# Patient Record
Sex: Female | Born: 1975 | Race: Black or African American | Hispanic: No | Marital: Married | Smoking: Never smoker
Health system: Southern US, Community
[De-identification: ages and names within clinical notes are randomized; demographics above are authoritative.]

## PROBLEM LIST (undated history)

## (undated) DIAGNOSIS — Z975 Presence of (intrauterine) contraceptive device: Secondary | ICD-10-CM

## (undated) DIAGNOSIS — J45909 Unspecified asthma, uncomplicated: Secondary | ICD-10-CM

## (undated) HISTORY — DX: Unspecified asthma, uncomplicated: J45.909

## (undated) HISTORY — DX: Presence of (intrauterine) contraceptive device: Z97.5

---

## 1999-09-28 ENCOUNTER — Encounter: Payer: Self-pay | Admitting: *Deleted

## 1999-09-28 ENCOUNTER — Ambulatory Visit (HOSPITAL_COMMUNITY): Admission: RE | Admit: 1999-09-28 | Discharge: 1999-09-28 | Payer: Self-pay | Admitting: *Deleted

## 2000-01-19 ENCOUNTER — Inpatient Hospital Stay (HOSPITAL_COMMUNITY): Admission: AD | Admit: 2000-01-19 | Discharge: 2000-01-22 | Payer: Self-pay | Admitting: *Deleted

## 2001-02-19 ENCOUNTER — Other Ambulatory Visit: Admission: RE | Admit: 2001-02-19 | Discharge: 2001-02-19 | Payer: Self-pay | Admitting: Obstetrics & Gynecology

## 2001-09-02 ENCOUNTER — Inpatient Hospital Stay (HOSPITAL_COMMUNITY): Admission: AD | Admit: 2001-09-02 | Discharge: 2001-09-03 | Payer: Self-pay | Admitting: Obstetrics and Gynecology

## 2003-05-12 ENCOUNTER — Other Ambulatory Visit: Admission: RE | Admit: 2003-05-12 | Discharge: 2003-05-12 | Payer: Self-pay | Admitting: *Deleted

## 2003-11-28 ENCOUNTER — Inpatient Hospital Stay (HOSPITAL_COMMUNITY): Admission: AD | Admit: 2003-11-28 | Discharge: 2003-12-01 | Payer: Self-pay | Admitting: Obstetrics and Gynecology

## 2005-09-02 ENCOUNTER — Inpatient Hospital Stay (HOSPITAL_COMMUNITY): Admission: AD | Admit: 2005-09-02 | Discharge: 2005-09-02 | Payer: Self-pay | Admitting: Obstetrics and Gynecology

## 2005-09-06 ENCOUNTER — Inpatient Hospital Stay (HOSPITAL_COMMUNITY): Admission: AD | Admit: 2005-09-06 | Discharge: 2005-09-09 | Payer: Self-pay | Admitting: Obstetrics and Gynecology

## 2007-02-21 ENCOUNTER — Inpatient Hospital Stay (HOSPITAL_COMMUNITY): Admission: AD | Admit: 2007-02-21 | Discharge: 2007-02-21 | Payer: Self-pay | Admitting: Obstetrics & Gynecology

## 2007-02-21 ENCOUNTER — Ambulatory Visit: Payer: Self-pay | Admitting: Physician Assistant

## 2007-02-24 ENCOUNTER — Ambulatory Visit (HOSPITAL_COMMUNITY): Admission: RE | Admit: 2007-02-24 | Discharge: 2007-02-24 | Payer: Self-pay | Admitting: Family Medicine

## 2007-02-26 ENCOUNTER — Ambulatory Visit: Payer: Self-pay | Admitting: *Deleted

## 2007-02-26 ENCOUNTER — Encounter (INDEPENDENT_AMBULATORY_CARE_PROVIDER_SITE_OTHER): Payer: Self-pay | Admitting: *Deleted

## 2007-03-12 ENCOUNTER — Ambulatory Visit: Payer: Self-pay | Admitting: Obstetrics & Gynecology

## 2007-03-26 ENCOUNTER — Ambulatory Visit: Payer: Self-pay | Admitting: *Deleted

## 2007-03-31 ENCOUNTER — Ambulatory Visit: Payer: Self-pay | Admitting: Physician Assistant

## 2007-04-07 ENCOUNTER — Ambulatory Visit: Payer: Self-pay | Admitting: Obstetrics & Gynecology

## 2007-04-14 ENCOUNTER — Ambulatory Visit: Payer: Self-pay | Admitting: Obstetrics & Gynecology

## 2007-04-16 ENCOUNTER — Ambulatory Visit (HOSPITAL_COMMUNITY): Admission: RE | Admit: 2007-04-16 | Discharge: 2007-04-16 | Payer: Self-pay | Admitting: Family Medicine

## 2007-04-20 ENCOUNTER — Ambulatory Visit: Payer: Self-pay | Admitting: Physician Assistant

## 2007-04-20 ENCOUNTER — Inpatient Hospital Stay (HOSPITAL_COMMUNITY): Admission: AD | Admit: 2007-04-20 | Discharge: 2007-04-22 | Payer: Self-pay | Admitting: Gynecology

## 2008-10-19 ENCOUNTER — Ambulatory Visit (HOSPITAL_COMMUNITY): Admission: RE | Admit: 2008-10-19 | Discharge: 2008-10-19 | Payer: Self-pay | Admitting: Family Medicine

## 2009-01-11 ENCOUNTER — Ambulatory Visit: Payer: Self-pay | Admitting: Obstetrics and Gynecology

## 2009-01-11 ENCOUNTER — Inpatient Hospital Stay (HOSPITAL_COMMUNITY): Admission: AD | Admit: 2009-01-11 | Discharge: 2009-01-11 | Payer: Self-pay | Admitting: Obstetrics & Gynecology

## 2009-01-21 ENCOUNTER — Ambulatory Visit: Payer: Self-pay | Admitting: Advanced Practice Midwife

## 2009-01-21 ENCOUNTER — Inpatient Hospital Stay (HOSPITAL_COMMUNITY): Admission: AD | Admit: 2009-01-21 | Discharge: 2009-01-23 | Payer: Self-pay | Admitting: Obstetrics & Gynecology

## 2009-08-23 IMAGING — US US OB COMP +14 WK
1 of 2 series · 14 of 28 positions shown · non-contrast
Comparison: none

OBSTETRICAL ULTRASOUND:

 This ultrasound exam was performed in the [HOSPITAL] Ultrasound Department.  The OB US report was generated in the AS system, and faxed to the ordering physician.  This report is also available in [REDACTED] PACS.

[Series 1: us ob comp +14 wk · 14 of 73 slices shown]
[im 1/73]
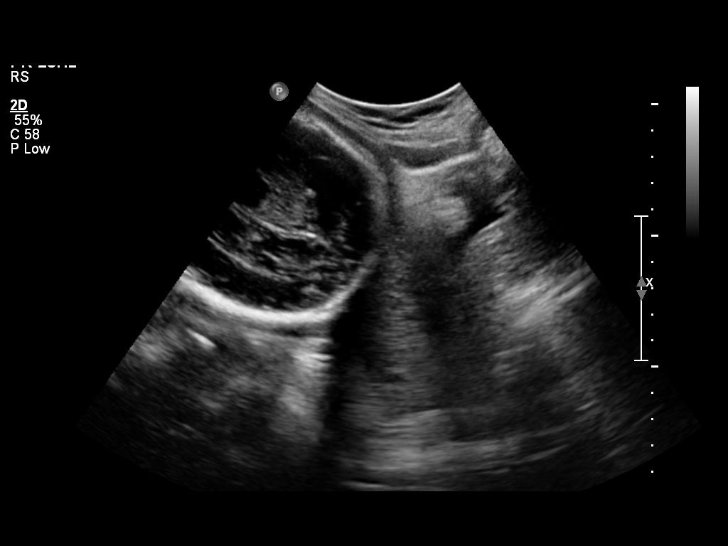
[im 6/73]
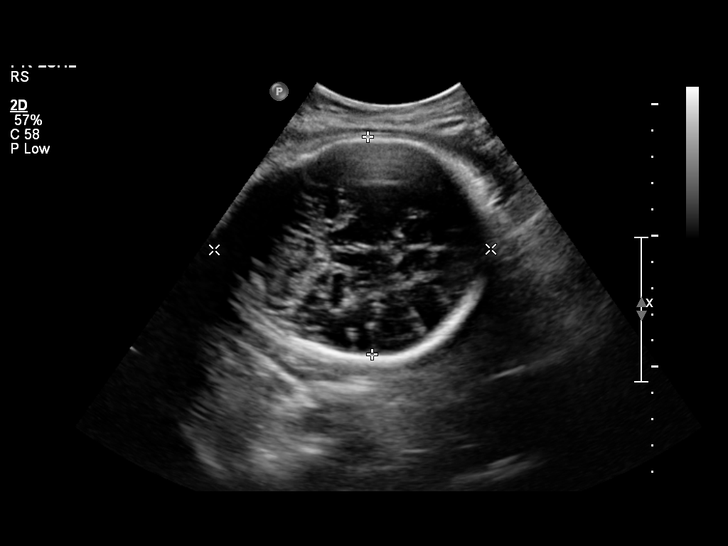
[im 12/73]
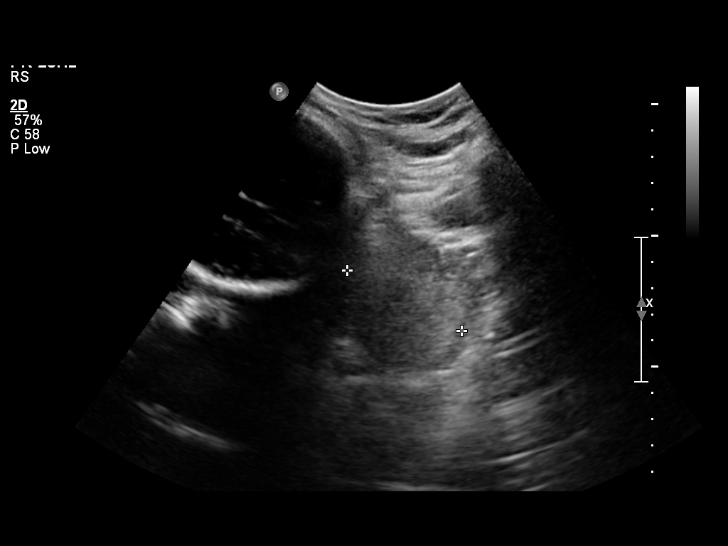
[im 17/73]
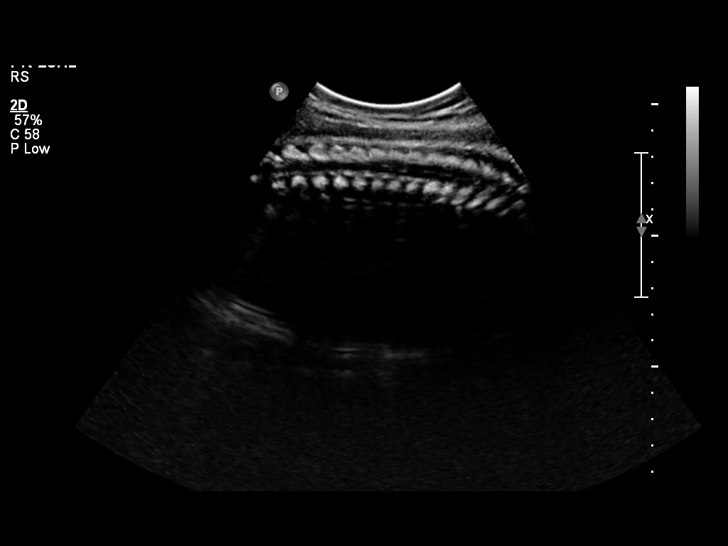
[im 23/73]
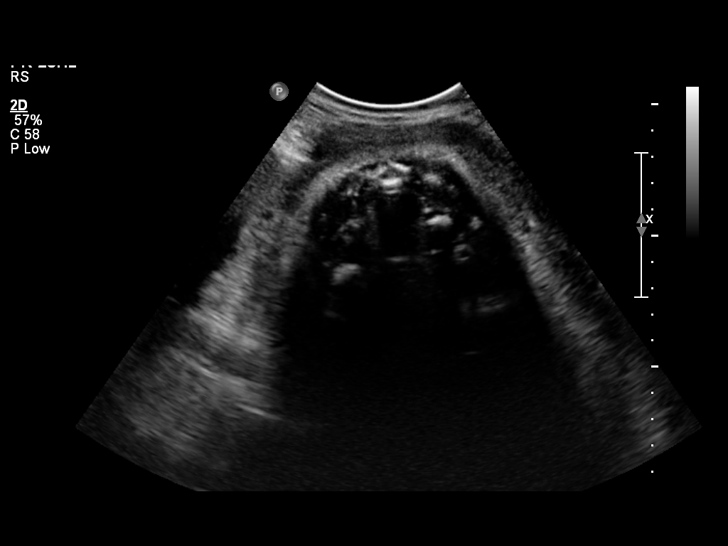
[im 28/73]
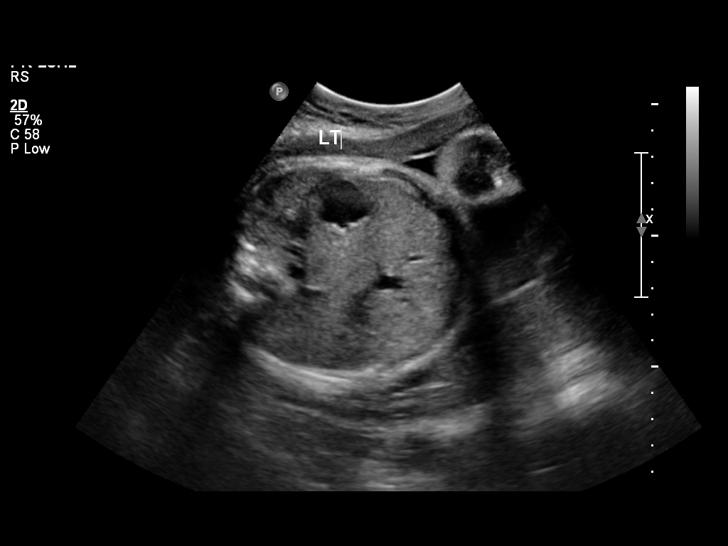
[im 34/73]
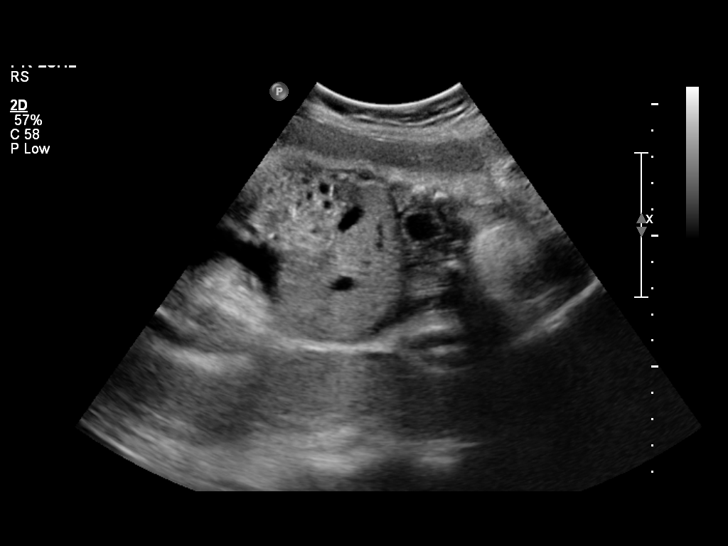
[im 39/73]
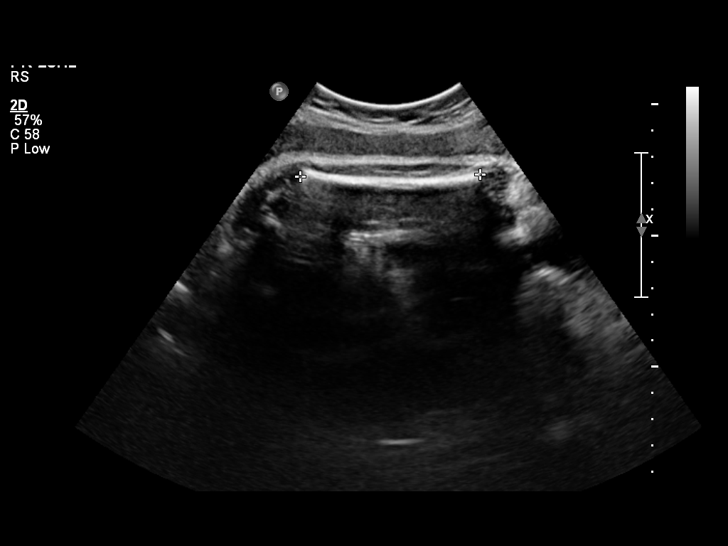
[im 45/73]
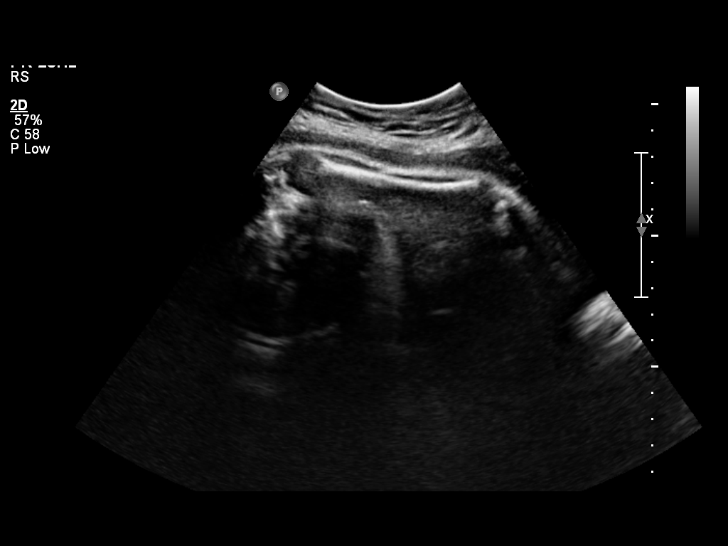
[im 50/73]
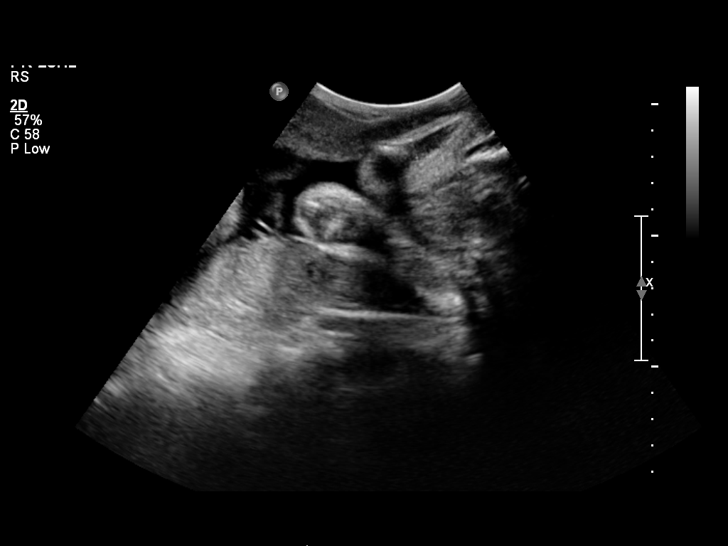
[im 56/73]
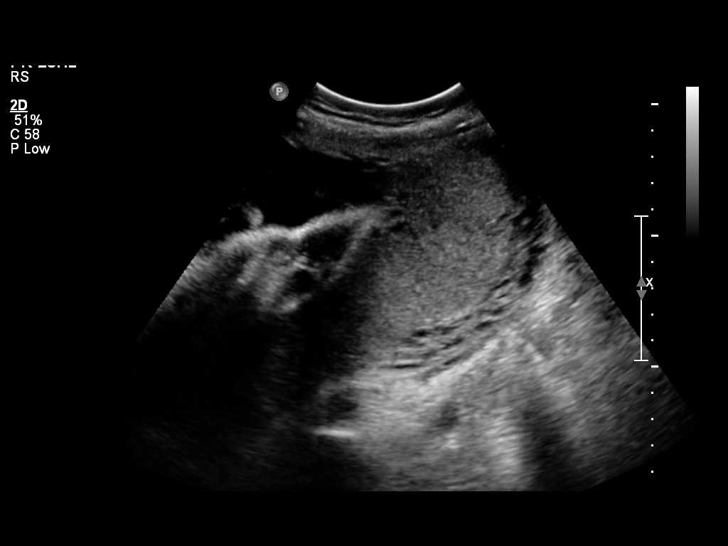
[im 61/73]
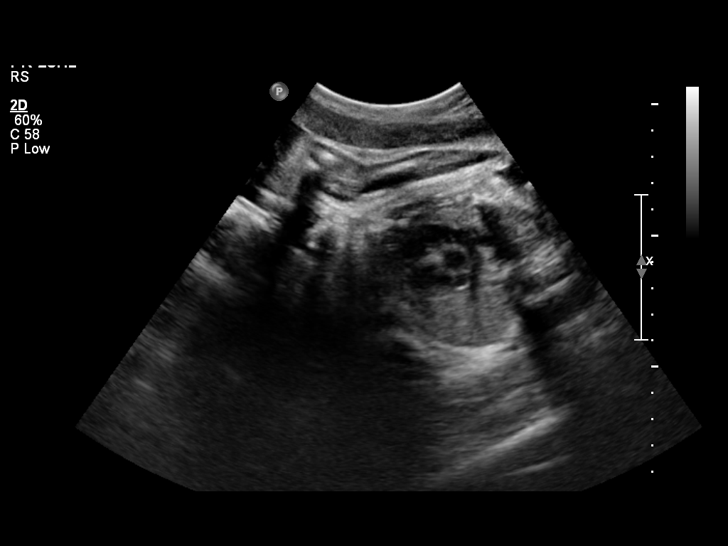
[im 67/73]
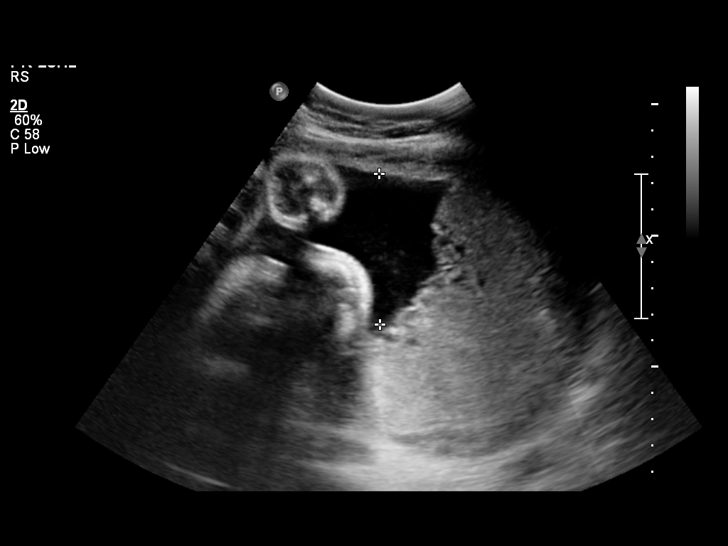
[im 73/73]
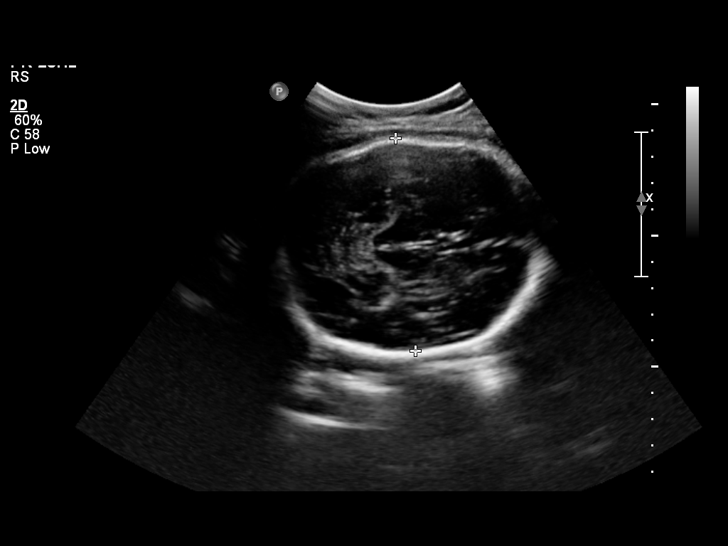

[14 of 28 positions shown; findings below may reference images not displayed]

IMPRESSION: See AS Obstetric US report.

## 2010-07-10 ENCOUNTER — Encounter: Payer: Self-pay | Admitting: *Deleted

## 2010-09-23 LAB — CBC
HCT: 34.8 % — ABNORMAL LOW (ref 36.0–46.0)
Hemoglobin: 11.9 g/dL — ABNORMAL LOW (ref 12.0–15.0)
MCV: 86.8 fL (ref 78.0–100.0)
RBC: 4.01 MIL/uL (ref 3.87–5.11)
WBC: 5.9 10*3/uL (ref 4.0–10.5)

## 2010-11-03 NOTE — H&P (Signed)
[facility]  Patient:    Shannon Knox, Shannon Knox Visit Number: [id number] MRN: [id number]          Service Type: OBS Location: [id number] Attending Physician:  Lenoard Aden Dictated by:   Marina Gravel, M.D. Admit Date:  09/02/2001                           History and Physical  ADMISSION DIAGNOSIS:          Grand multiparous patient in active labor at term.  HISTORY OF PRESENT ILLNESS:   A 35 year old African female gravida 6 para 4 A1 seen today in the office for evaluation of contractions.  Describes painful contractions every 10 to 15 minutes since last night.  Has had no leakage of fluid or vaginal bleeding.  Was found to be 6 cm dilated in the office and is admitted in labor.  PAST MEDICAL HISTORY:         None.  PAST SURGICAL HISTORY:        None.  FAMILY HISTORY:               Noncontributory.  PAST OBSTETRICAL HISTORY:     Four term spontaneous vaginal deliveries, duration of labor ranging from one to eight hours.  Birth weights ranging from 7.5 to 9.5 pounds.  No complications.  MEDICATIONS:                  Prenatal vitamins.  ALLERGIES:                    None.  PRENATAL LABORATORY DATA:     Group B strep is negative.  B positive.  Rubella immune.  Gonorrhea, chlamydia, HIV, hepatitis B, RPR - all negative.  Glucola within normal limits.  PHYSICAL EXAMINATION:  VITAL SIGNS:                  Blood pressure 90/60, weight 155.  GENERAL:                      Alert and oriented, no acute distress.  SKIN:                         Warm and dry, no lesions.  HEART:                        Regular rate and rhythm.  LUNGS:                        Clear to auscultation.  ABDOMEN:                      Gravid.  Fundal height appropriate.  Estimated fetal weight 7.5 to 8 pounds.  PELVIC:                       Cervix is 6 cm dilated, 80% effaced, -1 station, vertex presentation.  ASSESSMENT:                   Term intrauterine  pregnancy in a grand multiparous patient in spontaneous labor.  PLAN:                         Admission for expectant management of labor. Dictated by:   Gerri Spore  Earlene Plater, M.D. Attending Physician:  Lenoard Aden DD:  09/02/01 TD:  09/02/01 Job: [id number]

## 2011-03-27 LAB — CBC
HCT: 36.5
Hemoglobin: 10.7 — ABNORMAL LOW
Hemoglobin: 12.4
MCHC: 33.4
MCHC: 34
MCV: 85.2
RBC: 3.73 — ABNORMAL LOW
RBC: 4.29
RDW: 14.8 — ABNORMAL HIGH
RDW: 15.1 — ABNORMAL HIGH

## 2011-03-27 LAB — RPR: RPR Ser Ql: NONREACTIVE

## 2011-03-28 LAB — POCT URINALYSIS DIP (DEVICE)
Bilirubin Urine: NEGATIVE
Bilirubin Urine: NEGATIVE
Glucose, UA: NEGATIVE
Glucose, UA: NEGATIVE
Nitrite: NEGATIVE
Nitrite: NEGATIVE
Operator id: 194561
Operator id: 297281
Specific Gravity, Urine: 1.025
Urobilinogen, UA: 0.2
Urobilinogen, UA: 1

## 2011-03-29 LAB — POCT URINALYSIS DIP (DEVICE)
Bilirubin Urine: NEGATIVE
Glucose, UA: NEGATIVE
Glucose, UA: NEGATIVE
Glucose, UA: NEGATIVE
Hgb urine dipstick: NEGATIVE
Hgb urine dipstick: NEGATIVE
Nitrite: NEGATIVE
Nitrite: NEGATIVE
Nitrite: NEGATIVE
Operator id: 120861
Operator id: 14902
Operator id: 297281
Urobilinogen, UA: 0.2
Urobilinogen, UA: 0.2
Urobilinogen, UA: 0.2

## 2011-03-30 LAB — CBC
MCHC: 33.8
MCV: 84.4
Platelets: 199
WBC: 5.6

## 2011-03-30 LAB — HEPATITIS B SURFACE ANTIGEN: Hepatitis B Surface Ag: NEGATIVE

## 2011-03-30 LAB — URINALYSIS, ROUTINE W REFLEX MICROSCOPIC
Hgb urine dipstick: NEGATIVE
Protein, ur: NEGATIVE
Urobilinogen, UA: 0.2

## 2011-03-30 LAB — DIFFERENTIAL
Basophils Relative: 2 — ABNORMAL HIGH
Eosinophils Absolute: 0.2
Lymphs Abs: 1.6
Neutrophils Relative %: 59

## 2011-03-30 LAB — RPR: RPR Ser Ql: NONREACTIVE

## 2011-03-30 LAB — URINE CULTURE

## 2015-04-27 ENCOUNTER — Ambulatory Visit: Payer: Self-pay | Admitting: Internal Medicine

## 2015-06-24 ENCOUNTER — Other Ambulatory Visit: Payer: Self-pay | Admitting: Family Medicine

## 2015-06-24 DIAGNOSIS — R1084 Generalized abdominal pain: Secondary | ICD-10-CM

## 2015-06-24 DIAGNOSIS — R1900 Intra-abdominal and pelvic swelling, mass and lump, unspecified site: Secondary | ICD-10-CM

## 2015-07-04 ENCOUNTER — Inpatient Hospital Stay: Admission: RE | Admit: 2015-07-04 | Payer: Self-pay | Source: Ambulatory Visit

## 2015-07-05 ENCOUNTER — Other Ambulatory Visit: Payer: Self-pay

## 2015-07-12 ENCOUNTER — Ambulatory Visit
Admission: RE | Admit: 2015-07-12 | Discharge: 2015-07-12 | Disposition: A | Discharge status: Home / Self Care | Payer: 59 | Source: Ambulatory Visit | Attending: Family Medicine | Admitting: Family Medicine

## 2015-07-12 ENCOUNTER — Other Ambulatory Visit: Payer: Self-pay

## 2015-07-12 ENCOUNTER — Other Ambulatory Visit: Payer: Self-pay | Admitting: Family Medicine

## 2015-07-12 DIAGNOSIS — R1084 Generalized abdominal pain: Secondary | ICD-10-CM

## 2015-07-12 DIAGNOSIS — R1900 Intra-abdominal and pelvic swelling, mass and lump, unspecified site: Secondary | ICD-10-CM

## 2015-07-15 ENCOUNTER — Inpatient Hospital Stay: Admission: RE | Admit: 2015-07-15 | Payer: Self-pay | Source: Ambulatory Visit

## 2017-10-17 ENCOUNTER — Ambulatory Visit (INDEPENDENT_AMBULATORY_CARE_PROVIDER_SITE_OTHER): Payer: 59 | Admitting: Obstetrics & Gynecology

## 2017-10-17 ENCOUNTER — Encounter: Payer: Self-pay | Admitting: Obstetrics & Gynecology

## 2017-10-17 VITALS — BP 112/70 | Ht 63.5 in | Wt 172.0 lb

## 2017-10-17 DIAGNOSIS — Z30431 Encounter for routine checking of intrauterine contraceptive device: Secondary | ICD-10-CM | POA: Diagnosis not present

## 2017-10-17 DIAGNOSIS — N926 Irregular menstruation, unspecified: Secondary | ICD-10-CM | POA: Diagnosis not present

## 2017-10-17 DIAGNOSIS — Z01419 Encounter for gynecological examination (general) (routine) without abnormal findings: Secondary | ICD-10-CM

## 2017-10-17 NOTE — Progress Notes (Signed)
Shannon Knox [birth year] [id number]   History:    42 y.o. [id number]  Married.  Has 3 grand-children.  RP:  Established patient presenting for annual gyn exam   HPI: On Paragard IUD x 9 years.  Menses every month normal until had no menses x 3 months, then bled x 1 month every day from end of March to 4/25th.  No pelvic pain.  Normal vaginal secretions.  Breasts wnl.  Urine/BMs wnl.  BMI 29.99.  Health labs with Fam MD.  Past medical history,surgical history, family history and social history were all reviewed and documented in the EPIC chart.  Gynecologic History Patient's last menstrual period was 09/14/2017. Contraception: Paragard IUD x 9 years Last Pap: 2 yrs ago, normal per patient. Last mammogram: 2 yrs ago, normal per patient. Bone Density: Never Colonoscopy: Never  Obstetric History OB History  Gravida Para Term Preterm AB Living  SAB TAB Ectopic Multiple Live Births  1            # Outcome Date GA Lbr Len/2nd Weight Sex Delivery Anes PTL Lv  10 SAB           9 Para           8 Para           7 Para           6 Para           5 Para           4 Para           3 Para           2 Para           1 Para              ROS: A ROS was performed and pertinent positives and negatives are included in the history.  GENERAL: No fevers or chills. HEENT: No change in vision, no earache, sore throat or sinus congestion. NECK: No pain or stiffness. CARDIOVASCULAR: No chest pain or pressure. No palpitations. PULMONARY: No shortness of breath, cough or wheeze. GASTROINTESTINAL: No abdominal pain, nausea, vomiting or diarrhea, melena or bright red blood per rectum. GENITOURINARY: No urinary frequency, urgency, hesitancy or dysuria. MUSCULOSKELETAL: No joint or muscle pain, no back pain, no recent trauma. DERMATOLOGIC: No rash, no itching, no lesions. ENDOCRINE: No polyuria, polydipsia, no heat or cold intolerance. No recent change in weight. HEMATOLOGICAL: No anemia or  easy bruising or bleeding. NEUROLOGIC: No headache, seizures, numbness, tingling or weakness. PSYCHIATRIC: No depression, no loss of interest in normal activity or change in sleep pattern.     Exam:   BP 112/70   Ht 5' 3.5" (1.613 m)   Wt 172 lb (78 kg)   LMP 09/14/2017   BMI 29.99 kg/m   Body mass index is 29.99 kg/m.  General appearance : Well developed well nourished female. No acute distress HEENT: Eyes: no retinal hemorrhage or exudates,  Neck supple, trachea midline, no carotid bruits, no thyroidmegaly Lungs: Clear to auscultation, no rhonchi or wheezes, or rib retractions  Heart: Regular rate and rhythm, no murmurs or gallops Breast:Examined in sitting and supine position were symmetrical in appearance, no palpable masses or tenderness,  no skin retraction, no nipple inversion, no nipple discharge, no skin discoloration, no axillary or supraclavicular lymphadenopathy Abdomen: no palpable masses or tenderness, no rebound or guarding Extremities: no edema or skin  discoloration or tenderness  Pelvic: Vulva: Normal             Vagina: No gross lesions or discharge  Cervix: No gross lesions or discharge.  Strings well-seen.  Pap reflex done.  Uterus  AV, normal size, shape and consistency, non-tender and mobile  Adnexa  Without masses or tenderness  Anus: Nomrla   Assessment/Plan:  42 y.o. female for annual exam   1. Encounter for routine gynecological examination with Papanicolaou smear of cervix Normal gynecologic exam.  Pap reflex done.  Breast exam normal.  Schedule screening mammogram now.  Health labs with family physician.  Encouraged regular physical activity.  2. Encounter for routine checking of intrauterine contraceptive device (IUD) Paragard x 9 years.  Patient prefers to keep it up to the 10 years, will discuss further with the pelvic ultrasound results.  3. Irregular menses Menometrorrhagia for the last 1 to 2 months.  Will follow up with a pelvic ultrasound  to rule out uterine or endometrial pathology.  Will decide on management per results. - US Transvaginal Non-OB; Future  Counseling on above issues and coordination of care more than 50% for 10 minutes.  Genia Del MD, 3:53 PM 10/17/2017

## 2017-10-18 LAB — PAP IG W/ RFLX HPV ASCU

## 2017-10-20 ENCOUNTER — Encounter: Payer: Self-pay | Admitting: Obstetrics & Gynecology

## 2017-10-20 NOTE — Patient Instructions (Signed)
1. Encounter for routine gynecological examination with Papanicolaou smear of cervix Normal gynecologic exam.  Pap reflex done.  Breast exam normal.  Schedule screening mammogram now.  Health labs with family physician.  Encouraged regular physical activity.  2. Encounter for routine checking of intrauterine contraceptive device (IUD) Paragard x 9 years.  Patient prefers to keep it up to the 10 years, will discuss further with the pelvic ultrasound results.  3. Irregular menses Menometrorrhagia for the last 1 to 2 months.  Will follow up with a pelvic ultrasound to rule out uterine or endometrial pathology.  Will decide on management per results. - US Transvaginal Non-OB; Future  Shannon Knox, it was a pleasure seeing you today!  I will inform you of your results as soon as they are available.

## 2017-11-19 ENCOUNTER — Ambulatory Visit: Payer: 59 | Admitting: Obstetrics & Gynecology

## 2017-11-19 ENCOUNTER — Other Ambulatory Visit: Payer: 59

## 2018-01-08 IMAGING — US US ABDOMEN LIMITED
1 series · 10 of 10 positions shown · non-contrast
Comparison: No recent prior.

CLINICAL DATA: Palpable lump.

EXAM:
LIMITED ABDOMINAL ULTRASOUND

[Series 1: us abdomen limited · 0.06mm/px · 10 of 10 slices shown]
[im 1/10]
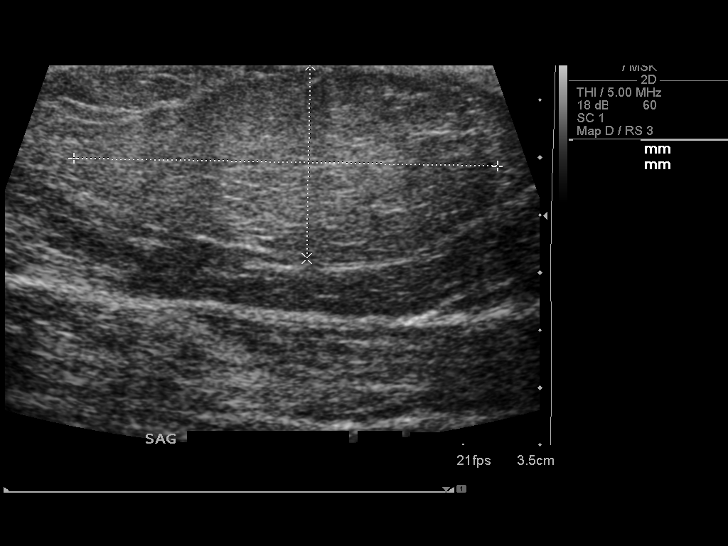
[im 2/10]
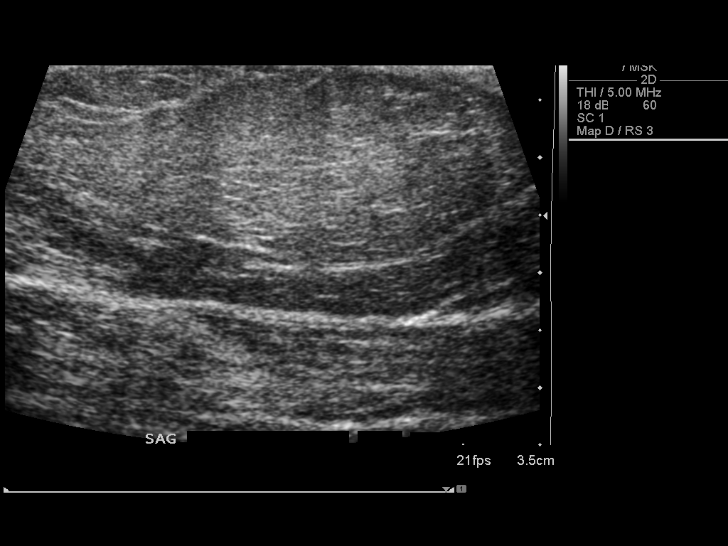
[im 3/10]
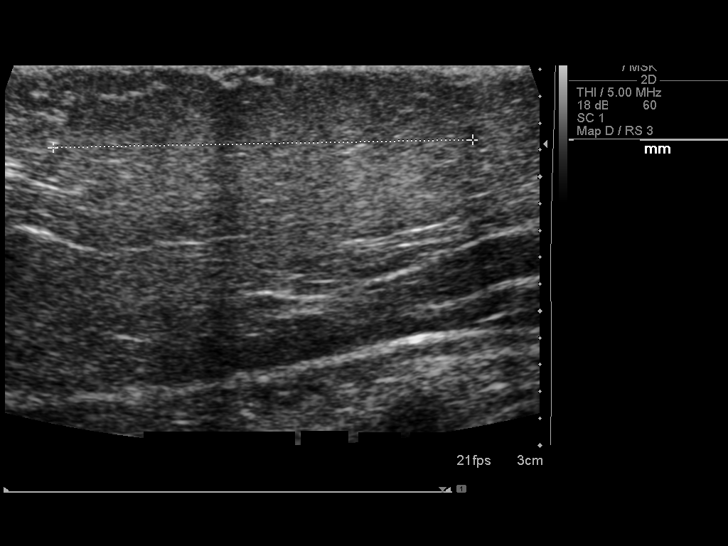
[im 4/10]
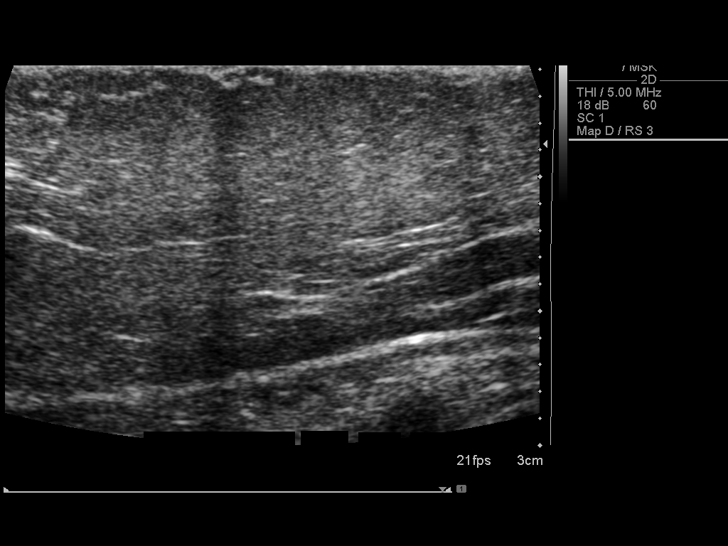
[im 5/10]
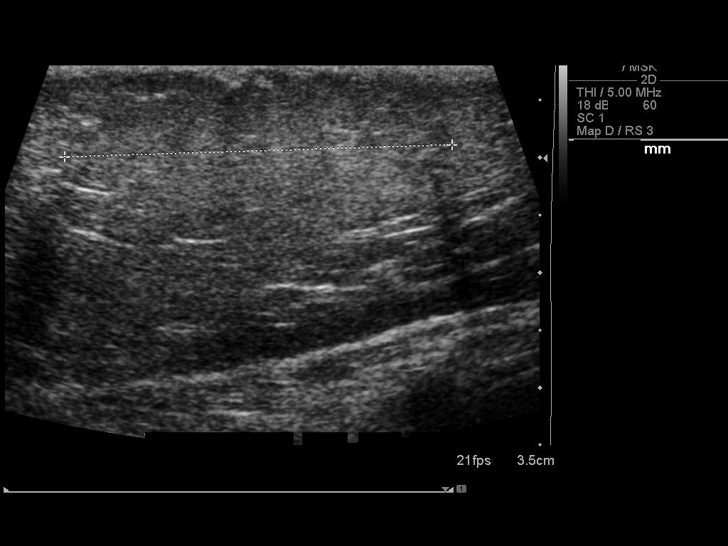
[im 6/10]
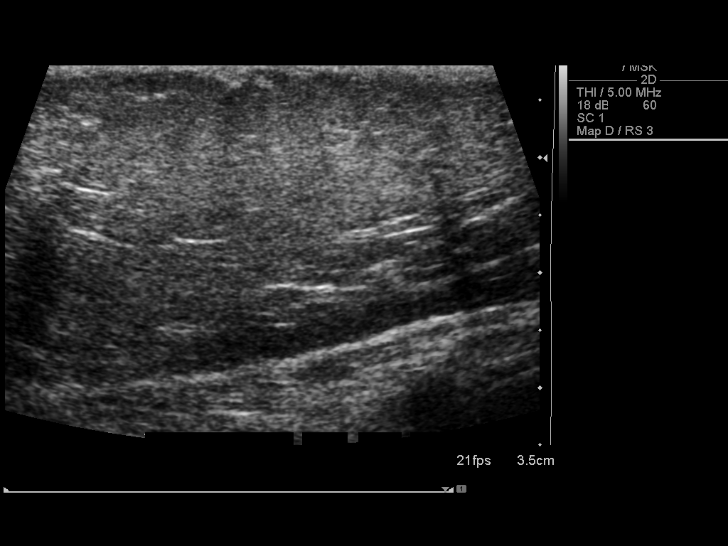
[im 7/10]
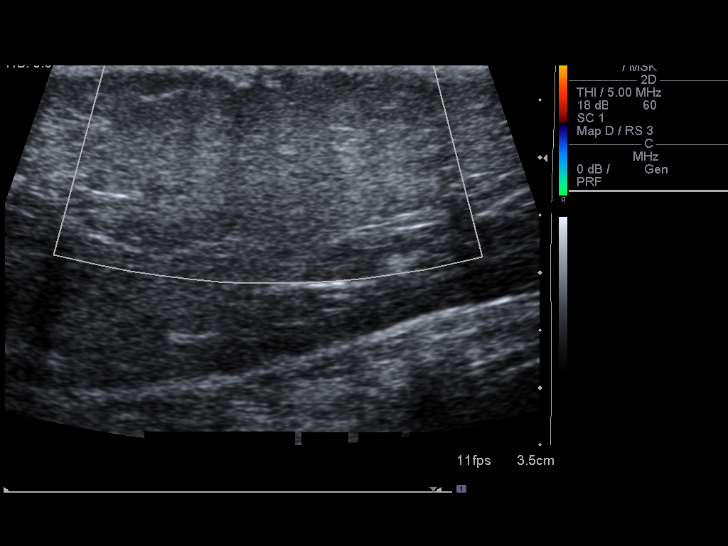
[im 8/10]
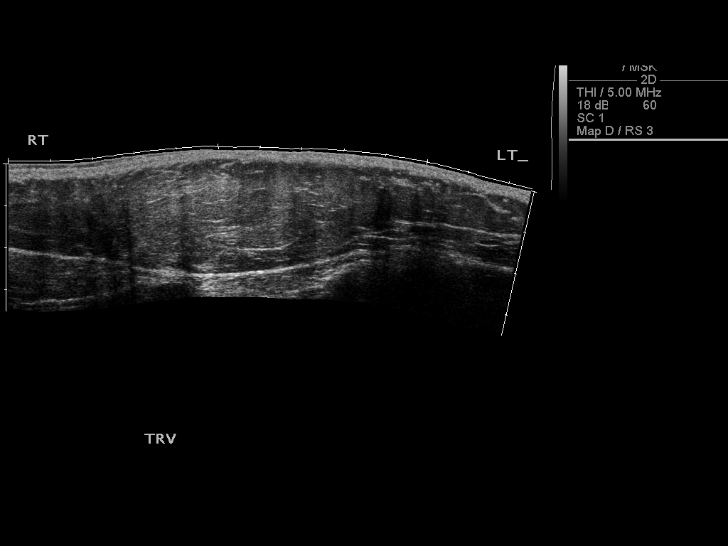
[im 9/10]
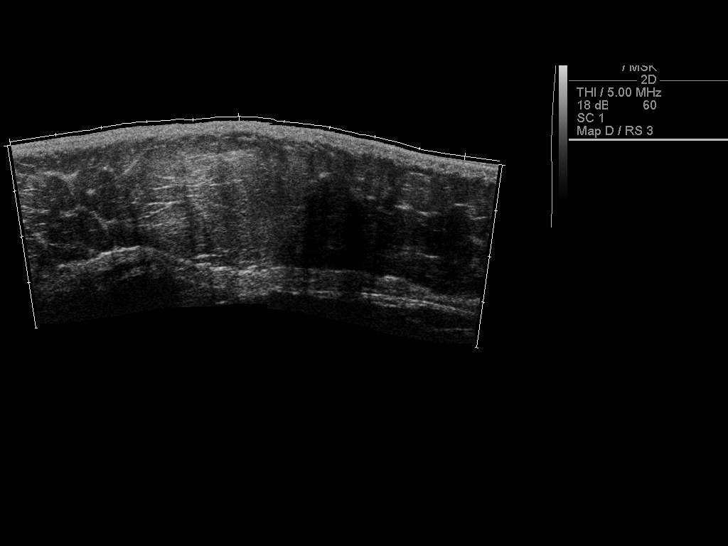
[im 10/10]
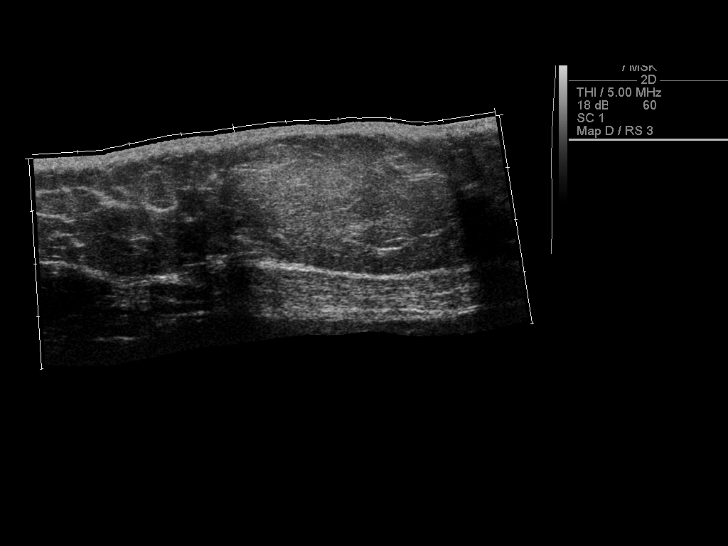

[10 of 10 positions shown; findings below may reference images not displayed]

FINDINGS: A 3.7 x 1.7 x 3.1 cm solid-appearing slightly hyperechoic focus is
noted in the region of palpable abnormality. This may represent a
lipoma. The borders however slightly indistinct. Further evaluation
with gadolinium-enhanced MRI should be considered.
IMPRESSION: 3.7 x 1.7 x 3.1 cm hyperechoic solid lesion in the region of the
palpable abnormality, possibly a lipoma. The borders of the lesion
are slightly indistinct. Further evaluation with gadolinium-enhanced
MRI should be considered.

## 2021-10-09 ENCOUNTER — Ambulatory Visit (INDEPENDENT_AMBULATORY_CARE_PROVIDER_SITE_OTHER): Payer: Medicaid Other | Admitting: Family

## 2021-10-09 ENCOUNTER — Encounter: Payer: Self-pay | Admitting: Family

## 2021-10-09 VITALS — BP 128/76 | HR 62 | Temp 97.3°F | Ht 64.5 in | Wt 197.4 lb

## 2021-10-09 DIAGNOSIS — J4520 Mild intermittent asthma, uncomplicated: Secondary | ICD-10-CM

## 2021-10-09 DIAGNOSIS — Z1211 Encounter for screening for malignant neoplasm of colon: Secondary | ICD-10-CM

## 2021-10-09 DIAGNOSIS — Z113 Encounter for screening for infections with a predominantly sexual mode of transmission: Secondary | ICD-10-CM

## 2021-10-09 DIAGNOSIS — Z975 Presence of (intrauterine) contraceptive device: Secondary | ICD-10-CM | POA: Diagnosis not present

## 2021-10-09 DIAGNOSIS — R1900 Intra-abdominal and pelvic swelling, mass and lump, unspecified site: Secondary | ICD-10-CM

## 2021-10-09 DIAGNOSIS — Z13220 Encounter for screening for lipoid disorders: Secondary | ICD-10-CM

## 2021-10-09 DIAGNOSIS — M79672 Pain in left foot: Secondary | ICD-10-CM

## 2021-10-09 DIAGNOSIS — H6123 Impacted cerumen, bilateral: Secondary | ICD-10-CM

## 2021-10-09 DIAGNOSIS — Z7689 Persons encountering health services in other specified circumstances: Secondary | ICD-10-CM

## 2021-10-09 DIAGNOSIS — Z1159 Encounter for screening for other viral diseases: Secondary | ICD-10-CM

## 2021-10-09 DIAGNOSIS — M79671 Pain in right foot: Secondary | ICD-10-CM

## 2021-10-09 DIAGNOSIS — E6609 Other obesity due to excess calories: Secondary | ICD-10-CM

## 2021-10-09 DIAGNOSIS — Z6833 Body mass index (BMI) 33.0-33.9, adult: Secondary | ICD-10-CM

## 2021-10-09 DIAGNOSIS — Z23 Encounter for immunization: Secondary | ICD-10-CM

## 2021-10-09 MED ORDER — TETANUS-DIPHTH-ACELL PERTUSSIS 5-2.5-18.5 LF-MCG/0.5 IM SUSP: 0.5000 mL | Freq: Once | INTRAMUSCULAR | 0 refills | Status: AC

## 2021-10-09 MED ORDER — ALBUTEROL SULFATE HFA 108 (90 BASE) MCG/ACT IN AERS: INHALATION_SPRAY | RESPIRATORY_TRACT | 5 refills | Status: AC

## 2021-10-09 MED ORDER — DEBROX 6.5 % OT SOLN: 5.0000 [drp] | Freq: Two times a day (BID) | OTIC | 0 refills | Status: DC

## 2021-10-09 NOTE — Patient Instructions (Addendum)
- Instill debrox 6.5 otic solution 5 drops into each ear twice daily x 4 days then follow up for ear lavage.May apply cotton ball at bedtime to prevent drainage to pillow. ?- Please get Tetanus vaccine at your pharmacy ? ? ?Fat and Cholesterol Restricted Eating Plan ?Getting too much fat and cholesterol in your diet may cause health problems. Choosing the right foods helps keep your fat and cholesterol at normal levels. This can keep you from getting certain diseases. ?Your doctor may recommend an eating plan that includes: ?Total fat: ______% or less of total calories a day. This is ______g of fat a day. ?Saturated fat: ______% or less of total calories a day. This is ______g of saturated fat a day. ?Cholesterol: less than _________mg a day. ?Fiber: ______g a day. ?What are tips for following this plan? ?General tips ?Work with your doctor to lose weight if you need to. ?Avoid: ?Foods with added sugar. ?Fried foods. ?Foods with trans fat or partially hydrogenated oils. This includes some margarines and baked goods. ?If you drink alcohol: ?Limit how much you have to: ?0-1 drink a day for women who are not pregnant. ?0-2 drinks a day for men. ?Know how much alcohol is in a drink. In the [place] one drink equals one 12 oz bottle of beer (355 mL), one 5 oz glass of wine (148 mL), or one 1? oz glass of hard liquor (44 mL). ?Reading food labels ?Check food labels for: ?Trans fats. ?Partially hydrogenated oils. ?Saturated fat (g) in each serving. ?Cholesterol (mg) in each serving. ?Fiber (g) in each serving. ?Choose foods with healthy fats, such as: ?Monounsaturated fats and polyunsaturated fats. These include olive and canola oil, flaxseeds, walnuts, almonds, and seeds. ?Omega-3 fats. These are found in certain fish, flaxseed oil, and ground flaxseeds. ?Choose grain products that have whole grains. Look for the word "whole" as the first word in the ingredient list. ?Cooking ?Cook foods using low-fat methods. These include  baking, boiling, grilling, and broiling. ?Eat more home-cooked foods. Eat at restaurants and buffets less often. Eat less fast food. ?Avoid cooking using saturated fats, such as butter, cream, palm oil, palm kernel oil, and coconut oil. ?Meal planning ? ?At meals, divide your plate into four equal parts: ?Fill one-half of your plate with vegetables, green salads, and fruit. ?Fill one-fourth of your plate with whole grains. ?Fill one-fourth of your plate with low-fat (lean) protein foods. ?Eat fish that is high in omega-3 fats at least two times a week. This includes mackerel, tuna, sardines, and salmon. ?Eat foods that are high in fiber, such as whole grains, beans, apples, pears, berries, broccoli, carrots, peas, and barley. ?What foods should I eat? ?Fruits ?All fresh, canned (in natural juice), or frozen fruits. ?Vegetables ?Fresh or frozen vegetables (raw, steamed, roasted, or grilled). Green salads. ?Grains ?Whole grains, such as whole wheat or whole grain breads, crackers, cereals, and pasta. Unsweetened oatmeal, bulgur, barley, quinoa, or brown rice. Corn or whole wheat flour tortillas. ?Meats and other protein foods ?Ground beef (85% or leaner), grass-fed beef, or beef trimmed of fat. Skinless chicken or [place]. Ground chicken or [place]. Pork trimmed of fat. All fish and seafood. Egg whites. Dried beans, peas, or lentils. Unsalted nuts or seeds. Unsalted canned beans. Nut butters without added sugar or oil. ?Dairy ?Low-fat or nonfat dairy products, such as skim or 1% milk, 2% or reduced-fat cheeses, low-fat and fat-free ricotta or cottage cheese, or plain low-fat and nonfat yogurt. ?Fats and oils ?Tub margarine  without trans fats. Light or reduced-fat mayonnaise and salad dressings. Avocado. Olive, canola, sesame, or safflower oils. ?The items listed above may not be a complete list of foods and beverages you can eat. Contact a dietitian for more information. ?What foods should I avoid? ?Fruits ?Canned fruit  in heavy syrup. Fruit in cream or butter sauce. Fried fruit. ?Vegetables ?Vegetables cooked in cheese, cream, or butter sauce. Fried vegetables. ?Grains ?White bread. White pasta. White rice. Cornbread. Bagels, pastries, and croissants. Crackers and snack foods that contain trans fat and hydrogenated oils. ?Meats and other protein foods ?Fatty cuts of meat. Ribs, chicken wings, bacon, sausage, bologna, salami, chitterlings, fatback, hot dogs, bratwurst, and packaged lunch meats. Liver and organ meats. Whole eggs and egg yolks. Chicken and [place] with skin. Fried meat. ?Dairy ?Whole or 2% milk, cream, half-and-half, and cream cheese. Whole milk cheeses. Whole-fat or sweetened yogurt. Full-fat cheeses. Nondairy creamers and whipped toppings. Processed cheese, cheese spreads, and cheese curds. ?Fats and oils ?Butter, stick margarine, lard, shortening, ghee, or bacon fat. Coconut, palm kernel, and palm oils. ?Beverages ?Alcohol. Sugar-sweetened drinks such as sodas, lemonade, and fruit drinks. ?Sweets and desserts ?Corn syrup, sugars, honey, and molasses. Candy. Jam and jelly. Syrup. Sweetened cereals. Cookies, pies, cakes, donuts, muffins, and ice cream. ?The items listed above may not be a complete list of foods and beverages you should avoid. Contact a dietitian for more information. ?Summary ?Choosing the right foods helps keep your fat and cholesterol at normal levels. This can keep you from getting certain diseases. ?At meals, fill one-half of your plate with vegetables, green salads, and fruits. ?Eat high fiber foods, like whole grains, beans, apples, pears, berries, carrots, peas, and barley. ?Limit added sugar, saturated fats, alcohol, and fried foods. ?This information is not intended to replace advice given to you by your health care provider. Make sure you discuss any questions you have with your health care provider. ?Document Revised: 10/14/2020 Document Reviewed: 10/14/2020 ?Elsevier Patient Education ?  2023 [employer]vier Inc[employer] ? ?

## 2021-10-09 NOTE — Progress Notes (Signed)
? ?Provider: Richarda Blade FNP-C  ? ?Bereket Gernert, Donalee Citrin, NP ? ?Patient Care Team: ?Shannon Knox, Donalee Citrin, NP as PCP - General (Family Medicine) ? ?Extended Emergency Contact Information ?Primary Emergency Contact: Shannon,Knox ?Address: [address] ?         [name], [place] [address] [place] ?Home Phone: [phone] ?Relation: Spouse ? ?Code Status:  Full Code  ?Goals of care: Advanced Directive information ? ?  10/09/2021  ? 10:09 AM  ?Advanced Directives  ?Does Patient Have a Medical Advance Directive? No  ?Would patient like information on creating a medical advance directive? No - Patient declined  ? ? ? ?Chief Complaint  ?Patient presents with  ? Establish Care  ?  New patient to establish care. Discuss growth on chest ( x 10 years or longer, has changed in size) and right leg pain (x several years, getting worse).  No new patient packet received prior to visit, per patient she never received a packet.  Medication not present at initial appointment. Requesting rx for albuterol inhaler. Here with daughter Shannon Knox   ? ? ?HPI:  ?Pt is a 46 y.o. female seen today establish care here at [facility] for medical management of chronic diseases.She is here with her daughter Shannon Knox. ?Has a medical history of asthma. But overall states has been staying healthy.  Has not been seen by a provider for several years.  States her asthma is under control uses her albuterol inhaler every once in a while sometimes once a year. She denies any cough or wheezing. She requests a refill for albuterol since her previous one has expired. ? ?States the main reason why she is here today is due to worsening swelling on upper abdominal chest area for the past 10 years.  She is concerned since the area has started itching and unable to put on her bra due to being close to her bra line.  She denies any pain or tenderness. ? ?Also requests IUD to be removed.  States has had it over 10 years.  Denies any  abdominal pain, vaginal bleeding or discharge. ? ?She complains of heels being painful worse especially first thing in the morning when getting out of bed unable to step back to improve after walking.  She denies any swelling or redness on the legs.  States head injury 10 years ago to her right heel but has never had any pain. ? ?Does not exercising on regular basis.  Plans to start walking since she has gained weight. ? ?No history of smoking or alcohol use. ?No history of previous surgeries. ? ?Health maintenance: ?Due for screening colonoscopy.Agrees to referral to GI ? ?Also due for tetanus vaccines.Stated last tetanus shot was administered when she had her last bone was not 46 years old.Discussed with her to get tetanus vaccine at her pharmacy. ? ? ? ?Past Medical History:  ?Diagnosis Date  ? Asthma   ? Presence of IUD   ? ?History reviewed. No pertinent surgical history. ? ?No Known Allergies ? ?Allergies as of 10/09/2021   ?No Known Allergies ?  ? ?  ?Medication List  ?  ? ?  ? Accurate as of October 09, 2021 11:05 AM. If you have any questions, ask your nurse or doctor.  ?  ?  ? ?  ? ?albuterol 108 (90 Base) MCG/ACT inhaler ?Commonly known as: VENTOLIN HFA ?INHALE 1 PUFF BY MOUTH EVERY 4 HOURS AS NEEDED ?  ?Tdap 5-2.5-18.5 LF-MCG/0.5 injection ?Commonly  known as: BOOSTRIX ?Inject 0.5 mLs into the muscle once. ?  ? ?  ? ? ?Review of Systems  ?Constitutional:  Negative for appetite change, chills, fatigue, fever and unexpected weight change.  ?HENT:  Negative for congestion, dental problem, ear discharge, ear pain, facial swelling, hearing loss, nosebleeds, postnasal drip, rhinorrhea, sinus pressure, sinus pain, sneezing, sore throat, tinnitus and trouble swallowing.   ?Eyes:  Positive for visual disturbance. Negative for pain, discharge, redness and itching.  ?     Near sighted  ?  ?Respiratory:  Negative for cough, chest tightness, shortness of breath and wheezing.   ?Cardiovascular:  Negative for chest pain,  palpitations and leg swelling.  ?Gastrointestinal:  Negative for abdominal distention, abdominal pain, blood in stool, constipation, diarrhea, nausea and vomiting.  ?Endocrine: Negative for cold intolerance, heat intolerance, polydipsia, polyphagia and polyuria.  ?Genitourinary:  Negative for difficulty urinating, dysuria, flank pain, frequency and urgency.  ?Musculoskeletal:  Negative for arthralgias, back pain, gait problem, joint swelling, myalgias, neck pain and neck stiffness.  ?Skin:  Negative for color change, pallor, rash and wound.  ?Neurological:  Negative for dizziness, syncope, speech difficulty, weakness, light-headedness, numbness and headaches.  ?Hematological:  Does not bruise/bleed easily.  ?Psychiatric/Behavioral:  Negative for agitation, behavioral problems, confusion, hallucinations, self-injury, sleep disturbance and suicidal ideas. The patient is not nervous/anxious.   ? ? ?There is no immunization history on file for this patient. ?Pertinent  Health Maintenance Due  ?Topic Date Due  ? COLONOSCOPY (Pts [id number] Insurance coverage will need to be confirmed)  Never done  ? PAP SMEAR-Modifier  10/17/2020  ? INFLUENZA VACCINE  01/16/2022  ? ? ?  10/09/2021  ? 10:08 AM  ?Fall Risk  ?Falls in the past year? 1  ?Was there an injury with Fall? 0  ?Fall Risk Category Calculator 1  ?Fall Risk Category Low  ?Patient Fall Risk Level Low fall risk  ?Patient at Risk for Falls Due to No Fall Risks  ?Fall risk Follow up Falls evaluation completed  ? ?Functional Status Survey: ?  ? ?Vitals:  ? 10/09/21 1009  ?BP: 128/76  ?Pulse: 62  ?Temp: (!) 97.3 ?F (36.3 ?C)  ?TempSrc: Temporal  ?SpO2: 97%  ?Weight: 197 lb 6.4 oz (89.5 kg)  ?Height: 5' 4.5" (1.638 m)  ? ?Body mass index is 33.36 kg/m?Marland Kitchen. ?Physical Exam ?Vitals reviewed.  ?Constitutional:   ?   General: She is not in acute distress. ?   Appearance: Normal appearance. She is normal weight. She is not ill-appearing or diaphoretic.  ?HENT:  ?   Head: Normocephalic.   ?   Comments: Bilateral ear cerumen lavaged with warm water and hydrogen peroxide moderate amounts of cerumen obtained.right ear cerumen removed with curette.unable to remove all cerumen.Tolerated procedure well.TM not visualize. ? ?   Right Ear: There is impacted cerumen.  ?   Left Ear: There is impacted cerumen.  ?   Nose: Nose normal. No congestion or rhinorrhea.  ?   Mouth/Throat:  ?   Mouth: Mucous membranes are moist.  ?   Pharynx: Oropharynx is clear. No oropharyngeal exudate or posterior oropharyngeal erythema.  ?Eyes:  ?   General: No scleral icterus.    ?   Right eye: No discharge.     ?   Left eye: No discharge.  ?   Extraocular Movements: Extraocular movements intact.  ?   Conjunctiva/sclera: Conjunctivae normal.  ?   Pupils: Pupils are equal, round, and reactive to light.  ?Neck:  ?  Vascular: No carotid bruit.  ?Cardiovascular:  ?   Rate and Rhythm: Normal rate and regular rhythm.  ?   Pulses: Normal pulses.  ?   Heart sounds: Normal heart sounds. No murmur heard. ?  No friction rub. No gallop.  ?Pulmonary:  ?   Effort: Pulmonary effort is normal. No respiratory distress.  ?   Breath sounds: Normal breath sounds. No wheezing, rhonchi or rales.  ?Chest:  ?   Chest wall: No tenderness.  ?Abdominal:  ?   General: Bowel sounds are normal. There is no distension.  ?   Palpations: Abdomen is soft.  ?   Tenderness: There is no abdominal tenderness. There is no right CVA tenderness, left CVA tenderness, guarding or rebound.  ?   Comments: Upper epigastric -sternum area orange size mass soft tissue non-tender to palpation and without any redness or drainage.   ?Musculoskeletal:     ?   General: No swelling or tenderness. Normal range of motion.  ?   Cervical back: Normal range of motion. No rigidity or tenderness.  ?   Right lower leg: No edema.  ?   Left lower leg: No edema.  ?Lymphadenopathy:  ?   Cervical: No cervical adenopathy.  ?Skin: ?   General: Skin is warm and dry.  ?   Coloration: Skin is not pale.   ?   Findings: No bruising, erythema, lesion or rash.  ?Neurological:  ?   Mental Status: She is alert and oriented to person, place, and time.  ?   Cranial Nerves: No cranial nerve deficit.  ?   Sensory: N

## 2021-10-16 ENCOUNTER — Ambulatory Visit: Payer: Medicaid Other | Admitting: Family

## 2021-10-16 ENCOUNTER — Encounter: Payer: Self-pay | Admitting: Family

## 2021-10-16 VITALS — BP 128/86 | HR 63 | Temp 96.7°F | Resp 20

## 2021-10-16 DIAGNOSIS — Z6833 Body mass index (BMI) 33.0-33.9, adult: Secondary | ICD-10-CM

## 2021-10-16 DIAGNOSIS — Z1159 Encounter for screening for other viral diseases: Secondary | ICD-10-CM | POA: Diagnosis not present

## 2021-10-16 DIAGNOSIS — Z13220 Encounter for screening for lipoid disorders: Secondary | ICD-10-CM

## 2021-10-16 DIAGNOSIS — E6609 Other obesity due to excess calories: Secondary | ICD-10-CM | POA: Diagnosis not present

## 2021-10-16 DIAGNOSIS — H6123 Impacted cerumen, bilateral: Secondary | ICD-10-CM

## 2021-10-16 DIAGNOSIS — Z113 Encounter for screening for infections with a predominantly sexual mode of transmission: Secondary | ICD-10-CM | POA: Diagnosis not present

## 2021-10-16 DIAGNOSIS — J4520 Mild intermittent asthma, uncomplicated: Secondary | ICD-10-CM

## 2021-10-16 NOTE — Progress Notes (Signed)
? ?Provider: Marlowe Sax FNP-C ? ?Erie Radu, Nelda Bucks, NP ? ?Patient Care Team: ?Aaisha Sliter, Nelda Bucks, NP as PCP - General (Family Medicine) ? ?Extended Emergency Contact Information ?Primary Emergency Contact: Aboeid,Roggenkamp ?Address: [address] ?         [place], [place] [address] [place] ?Home Phone: [phone] ?Relation: Spouse ? ?Code Status:  DNR ?Goals of care: Advanced Directive information ? ?  10/09/2021  ? 10:09 AM  ?Advanced Directives  ?Does Patient Have a Medical Advance Directive? No  ?Would patient like information on creating a medical advance directive? No - Patient declined  ? ? ? ?Chief Complaint  ?Patient presents with  ? Acute Visit  ?  Patient presents today for ear lavage follow-up. She was prescribed debrox. She reports some discomfort/ soreness to bilateral ear. She states that when resting she feels/hear a fizzing/ popping sound.  ? ? ?HPI:  ?Pt is a 46 y.o. female seen today for an acute visit for bilateral ear lavage and fasting labs.She was here 10/09/2021 to establish care cerumen impaction was noted. She was advised to instil debrox 6.5 % otic solution 5 drops into each ear twice daily x 4 days then follow up today for ear lavage.Has used debrox as directed.Has had popping sound and feels Fizzy in both ears.denies any fever,chills,ear pain,tinnitus or changes in hearing.  ? ? ? ?Past Medical History:  ?Diagnosis Date  ? Asthma   ? Presence of IUD   ? ?History reviewed. No pertinent surgical history. ? ?No Known Allergies ? ?Outpatient Encounter Medications as of 10/16/2021  ?Medication Sig  ? albuterol (VENTOLIN HFA) 108 (90 Base) MCG/ACT inhaler INHALE 1 PUFF BY MOUTH EVERY 4 HOURS AS NEEDED  ? carbamide peroxide (DEBROX) 6.5 % OTIC solution Place 5 drops into both ears 2 (two) times daily.  ? ?No facility-administered encounter medications on file as of 10/16/2021.  ? ? ?Review of Systems  ?Constitutional:  Negative for chills, fatigue and fever.  ?HENT:  Negative for  congestion, ear pain, hearing loss, postnasal drip, rhinorrhea, sinus pressure, sinus pain, sneezing, sore throat and tinnitus.   ?Eyes:  Negative for pain, discharge, redness and itching.  ?Respiratory:  Negative for cough, chest tightness, shortness of breath and wheezing.   ?Skin:  Negative for color change, pallor and rash.  ?Neurological:  Negative for dizziness, light-headedness and headaches.  ? ? ?There is no immunization history on file for this patient. ?Pertinent  Health Maintenance Due  ?Topic Date Due  ? COLONOSCOPY (Pts 45-42yr Insurance coverage will need to be confirmed)  Never done  ? PAP SMEAR-Modifier  10/17/2020  ? INFLUENZA VACCINE  01/16/2022  ? ? ?  10/09/2021  ? 10:08 AM  ?Fall Risk  ?Falls in the past year? 1  ?Was there an injury with Fall? 0  ?Fall Risk Category Calculator 1  ?Fall Risk Category Low  ?Patient Fall Risk Level Low fall risk  ?Patient at Risk for Falls Due to No Fall Risks  ?Fall risk Follow up Falls evaluation completed  ? ?Functional Status Survey: ?  ? ?Vitals:  ? 10/16/21 1012  ?BP: 128/86  ?Pulse: 63  ?Resp: 20  ?Temp: (!) 96.7 ?F (35.9 ?C)  ?SpO2: 97%  ? ?There is no height or weight on file to calculate BMI. ?Physical Exam ?Vitals reviewed.  ?Constitutional:   ?   General: She is not in acute distress. ?   Appearance: She is not ill-appearing.  ?HENT:  ?   Head:  Normocephalic.  ?   Right Ear: There is impacted cerumen.  ?   Left Ear: There is impacted cerumen.  ?   Ears:  ?   Comments: Left ear cerumen lavaged with warm water and hydrogen peroxide moderate amounts of cerumen obtained.Tolerated procedure well.TM clear without any signs of infection. ?Right ear cerumen soft and mostly out removed with Qtip tolerated procedure .TM clear.  ? ?   Nose: Nose normal. No congestion or rhinorrhea.  ?   Mouth/Throat:  ?   Mouth: Mucous membranes are moist.  ?   Pharynx: Oropharynx is clear. No oropharyngeal exudate or posterior oropharyngeal erythema.  ?Eyes:  ?   General: No  scleral icterus.    ?   Right eye: No discharge.     ?   Left eye: No discharge.  ?   Conjunctiva/sclera: Conjunctivae normal.  ?   Pupils: Pupils are equal, round, and reactive to light.  ?Neck:  ?   Vascular: No carotid bruit.  ?Cardiovascular:  ?   Rate and Rhythm: Normal rate and regular rhythm.  ?   Pulses: Normal pulses.  ?   Heart sounds: Normal heart sounds. No murmur heard. ?  No friction rub. No gallop.  ?Pulmonary:  ?   Effort: Pulmonary effort is normal. No respiratory distress.  ?   Breath sounds: Normal breath sounds. No wheezing, rhonchi or rales.  ?Musculoskeletal:  ?   Cervical back: Normal range of motion. No rigidity or tenderness.  ?Lymphadenopathy:  ?   Cervical: No cervical adenopathy.  ?Skin: ?   General: Skin is warm and dry.  ?   Coloration: Skin is not pale.  ?   Findings: No erythema or rash.  ?Neurological:  ?   Mental Status: She is alert and oriented to person, place, and time.  ?   Sensory: No sensory deficit.  ?   Motor: No weakness.  ?   Coordination: Coordination normal.  ?Psychiatric:     ?   Mood and Affect: Mood normal.     ?   Behavior: Behavior normal.  ? ? ?Labs reviewed: ?No results for input(s): NA, K, CL, CO2, GLUCOSE, BUN, CREATININE, CALCIUM, MG, PHOS in the last 8760 hours. ?No results for input(s): AST, ALT, ALKPHOS, BILITOT, PROT, ALBUMIN in the last 8760 hours. ?No results for input(s): WBC, NEUTROABS, HGB, HCT, MCV, PLT in the last 8760 hours. ?No results found for: TSH ?No results found for: HGBA1C ?No results found for: CHOL, HDL, LDLCALC, LDLDIRECT, TRIG, CHOLHDL ? ?Significant Diagnostic Results in last 30 days:  ?No results found. ? ?Assessment/Plan ? ?1. Bilateral impacted cerumen ?Left ear cerumen lavaged with warm water and hydrogen peroxide moderate amounts of cerumen obtained.Tolerated procedure well.TM clear without any signs of infection. ?Right ear cerumen soft and mostly out removed with Qtip tolerated procedure .TM clear.  ?- Advised to notify provider  for any fever,chills or pain in the ear.  ? ?2. Screen for STD (sexually transmitted disease) ?Here for lab work.Lab tech not in today.[facility] address given to get labs at [address] ?- HIV Antibody (routine testing w rflx) ? ?3. Encounter for hepatitis C screening test for low risk patient ?Here for lab work.Lab tech not in today.[facility] address given to get labs at [address] ?- Hep C Antibody ? ?4. Class 1 obesity due to excess calories without serious comorbidity with body mass index (BMI) of 33.0 to 33.9 in adult ?Here for lab work.Lab tech not  in today.[facility] address given to get labs at [address] ?- Lipid panel ?- TSH ?- CMP with eGFR([facility]) ?- CBC with Differential/Platelet ? ?5. Screening for hyperlipidemia ?Here for lab work.Lab tech not in today.[facility] address given to get labs at [address] ?- Lipid panel ? ?6. Mild intermittent asthma without complication ?stable ?Here for lab work.Lab tech not in today.[facility] address given to get labs at [address] ?- CBC with Differential/Platelet ? ? ?Family/ staff Communication: Reviewed plan of care with patient verbalized understanding  ? ?Labs/tests ordered: Has lab orders in place.Printed and given to patient to get lab worker drawn on [facility] at [facility].Address provided ? ?Next Appointment: Has appointment in place.  ? ?Sandrea Hughs, NP ?

## 2021-11-06 ENCOUNTER — Ambulatory Visit: Payer: 59 | Admitting: Podiatry

## 2021-12-20 ENCOUNTER — Ambulatory Visit: Payer: 59 | Admitting: Family

## 2022-10-10 ENCOUNTER — Encounter: Payer: Medicaid Other | Admitting: Family

## 2022-10-10 NOTE — Progress Notes (Signed)
  This encounter was created in error - please disregard. No show 

## 2023-12-17 ENCOUNTER — Other Ambulatory Visit: Payer: Self-pay | Admitting: Physician Assistant

## 2023-12-17 DIAGNOSIS — Z1231 Encounter for screening mammogram for malignant neoplasm of breast: Secondary | ICD-10-CM

## 2023-12-26 ENCOUNTER — Ambulatory Visit: Payer: Self-pay | Admitting: Surgery

## 2023-12-26 NOTE — H&P (View-Only) (Signed)
  Shannon Knox [id number]   Referring Provider:  Joshua Clayborne Sharps, PA   Subjective   Chief Complaint: Hernia     History of Present Illness:    48 year old woman presents for evaluation of abdominal wall mass.  This has been present for many years and has been increasing over time.  Denies associated nausea, vomiting, abdominal distention or significant pain.  No previous abdominal surgeries.  Review of Systems: A complete review of systems was obtained from the patient.  I have reviewed this information and discussed as appropriate with the patient.  See HPI as well for other ROS.   Medical History: Past Medical History:  Diagnosis Date   Asthma, unspecified asthma severity, unspecified whether complicated, unspecified whether persistent (HHS-HCC)     There is no problem list on file for this patient.   History reviewed. No pertinent surgical history.   No Known Allergies  No current outpatient medications on file prior to visit.   No current facility-administered medications on file prior to visit.    History reviewed. No pertinent family history.   Social History   Tobacco Use  Smoking Status Never  Smokeless Tobacco Never     Social History   Socioeconomic History   Marital status: Married  Tobacco Use   Smoking status: Never   Smokeless tobacco: Never  Substance and Sexual Activity   Alcohol use: Never   Drug use: Never    Objective:    Vitals:   12/26/23 0940  BP: 124/82  Pulse: 80  Weight: 82.1 kg (181 lb)  Height: 162.6 cm (5' 4)    Body mass index is 31.07 kg/m.  Gen: A&Ox3, no distress  Chest: respiratory effort is normal. Abdomen: soft, nondistended, nontender.  Approximately 10 cm smooth, mobile pedunculated mass in the subxiphoid/epigastric region.  No palpable fascial defect. Neuro: no gross deficit Psych: appropriate mood and affect, normal insight/judgment intact  Skin: warm and dry     Assessment and Plan:   Diagnoses and all orders for this visit:  Abdominal wall mass    By exam suspect this is a lipoma but may also represent small epigastric hernia with chronically incarcerated preperitoneal fat.  We discussed excision under anesthesia and if hernia present, we will repair.  We discussed the surgery and risks including bleeding, infection, pain, scarring, injury to adjacent structures, recurrence of the lesion, seroma/hematoma, poor wound healing or undesired cosmetic result.  Questions were welcomed and answered to her satisfaction.  She wishes to proceed with surgery.   [name] MD FACS

## 2023-12-26 NOTE — H&P (Signed)
  Shannon Knox [id number]   Referring Provider:  Joshua Clayborne Sharps, PA   Subjective   Chief Complaint: Hernia     History of Present Illness:    48 year old woman presents for evaluation of abdominal wall mass.  This has been present for many years and has been increasing over time.  Denies associated nausea, vomiting, abdominal distention or significant pain.  No previous abdominal surgeries.  Review of Systems: A complete review of systems was obtained from the patient.  I have reviewed this information and discussed as appropriate with the patient.  See HPI as well for other ROS.   Medical History: Past Medical History:  Diagnosis Date   Asthma, unspecified asthma severity, unspecified whether complicated, unspecified whether persistent (HHS-HCC)     There is no problem list on file for this patient.   History reviewed. No pertinent surgical history.   No Known Allergies  No current outpatient medications on file prior to visit.   No current facility-administered medications on file prior to visit.    History reviewed. No pertinent family history.   Social History   Tobacco Use  Smoking Status Never  Smokeless Tobacco Never     Social History   Socioeconomic History   Marital status: Married  Tobacco Use   Smoking status: Never   Smokeless tobacco: Never  Substance and Sexual Activity   Alcohol use: Never   Drug use: Never    Objective:    Vitals:   12/26/23 0940  BP: 124/82  Pulse: 80  Weight: 82.1 kg (181 lb)  Height: 162.6 cm (5' 4)    Body mass index is 31.07 kg/m.  Gen: A&Ox3, no distress  Chest: respiratory effort is normal. Abdomen: soft, nondistended, nontender.  Approximately 10 cm smooth, mobile pedunculated mass in the subxiphoid/epigastric region.  No palpable fascial defect. Neuro: no gross deficit Psych: appropriate mood and affect, normal insight/judgment intact  Skin: warm and dry     Assessment and Plan:   Diagnoses and all orders for this visit:  Abdominal wall mass    By exam suspect this is a lipoma but may also represent small epigastric hernia with chronically incarcerated preperitoneal fat.  We discussed excision under anesthesia and if hernia present, we will repair.  We discussed the surgery and risks including bleeding, infection, pain, scarring, injury to adjacent structures, recurrence of the lesion, seroma/hematoma, poor wound healing or undesired cosmetic result.  Questions were welcomed and answered to her satisfaction.  She wishes to proceed with surgery.   [name] MD FACS

## 2023-12-27 ENCOUNTER — Other Ambulatory Visit: Payer: Self-pay

## 2023-12-27 ENCOUNTER — Encounter (HOSPITAL_COMMUNITY): Payer: Self-pay

## 2023-12-27 ENCOUNTER — Encounter (HOSPITAL_COMMUNITY)
Admission: RE | Admit: 2023-12-27 | Discharge: 2023-12-27 | Disposition: A | Discharge status: Home / Self Care | Source: Ambulatory Visit | Attending: Surgery | Admitting: Surgery

## 2023-12-27 DIAGNOSIS — Z01818 Encounter for other preprocedural examination: Secondary | ICD-10-CM

## 2023-12-27 NOTE — Patient Instructions (Addendum)
 SURGICAL WAITING ROOM VISITATION  Patients having surgery or a procedure may have no more than 2 support people in the waiting area - these visitors may rotate.    Children under the age of 85 must have an adult with them who is not the patient.  Visitors with respiratory illnesses are discouraged from visiting and should remain at home.  If the patient needs to stay at the hospital during part of their recovery, the visitor guidelines for inpatient rooms apply. Pre-op nurse will coordinate an appropriate time for 1 support person to accompany patient in pre-op.  This support person may not rotate.    Please refer to the [facility] website for the visitor guidelines for Inpatients (after your surgery is over and you are in a regular room).       Your procedure is scheduled on:   12/30/2023    Report to [facility] Main Entrance    Report to admitting at   [phone]   Call this number if you have problems the morning of surgery [phone]   Do not eat food or drink liquids  :After Midnight.   After Midnight you may have the following liquids until _                         If you have questions, please contact your surgeon's office.   FOLLOW BOWEL PREP AND ANY ADDITIONAL PRE OP INSTRUCTIONS YOU RECEIVED FROM YOUR SURGEON'S OFFICE!!!     Oral Hygiene is also important to reduce your risk of infection.                                    Remember - BRUSH YOUR TEETH THE MORNING OF SURGERY WITH YOUR REGULAR TOOTHPASTE  DENTURES WILL BE REMOVED PRIOR TO SURGERY PLEASE DO NOT APPLY Poly grip OR ADHESIVES!!!   Do NOT smoke after Midnight   Stop all vitamins and herbal supplements 7 days before surgery.   Take these medicines the morning of surgery with A SIP OF WATER:    Inhalers as usual and bring   DO NOT TAKE ANY ORAL DIABETIC MEDICATIONS DAY OF YOUR SURGERY  Bring CPAP mask and tubing day of surgery.                              You may not have any metal on  your body including hair pins, jewelry, and body piercing             Do not wear make-up, lotions, powders, perfumes/cologne, or deodorant  Do not wear nail polish including gel and S&S, artificial/acrylic nails, or any other type of covering on natural nails including finger and toenails. If you have artificial nails, gel coating, etc. that needs to be removed by a nail salon please have this removed prior to surgery or surgery may need to be canceled/ delayed if the surgeon/ anesthesia feels like they are unable to be safely monitored.   Do not shave  48 hours prior to surgery.               Men may shave face and neck.   Do not bring valuables to the hospital. [facility] IS NOT             RESPONSIBLE   FOR VALUABLES.   Contacts, glasses,  dentures or bridgework may not be worn into surgery.   Bring small overnight bag day of surgery.   DO NOT BRING YOUR HOME MEDICATIONS TO THE HOSPITAL. PHARMACY WILL DISPENSE MEDICATIONS LISTED ON YOUR MEDICATION LIST TO YOU DURING YOUR ADMISSION IN THE HOSPITAL!    Patients discharged on the day of surgery will not be allowed to drive home.  Someone NEEDS to stay with you for the first 24 hours after anesthesia.   Special Instructions: Bring a copy of your healthcare power of attorney and living will documents the day of surgery if you haven't scanned them before.              Please read over the following fact sheets you were given: IF YOU HAVE QUESTIONS ABOUT YOUR PRE-OP INSTRUCTIONS PLEASE CALL [phone]   If you received a COVID test during your pre-op visit  it is requested that you wear a mask when out in public, stay away from anyone that may not be feeling well and notify your surgeon if you develop symptoms. If you test positive for Covid or have been in contact with anyone that has tested positive in the last 10 days please notify you surgeon.    [facility] - Preparing for Surgery Before surgery, you can play an important role.   Because skin is not sterile, your skin needs to be as free of germs as possible.  You can reduce the number of germs on your skin by washing with CHG (chlorahexidine gluconate) soap before surgery.  CHG is an antiseptic cleaner which kills germs and bonds with the skin to continue killing germs even after washing. Please DO NOT use if you have an allergy to CHG or antibacterial soaps.  If your skin becomes reddened/irritated stop using the CHG and inform your nurse when you arrive at Short Stay. Do not shave (including legs and underarms) for at least 48 hours prior to the first CHG shower.  You may shave your face/neck. Please follow these instructions carefully:  1.  Shower with CHG Soap the night before surgery and the  morning of Surgery.  2.  If you choose to wash your hair, wash your hair first as usual with your  normal  shampoo.  3.  After you shampoo, rinse your hair and body thoroughly to remove the  shampoo.                           4.  Use CHG as you would any other liquid soap.  You can apply chg directly  to the skin and wash                       Gently with a scrungie or clean washcloth.  5.  Apply the CHG Soap to your body ONLY FROM THE NECK DOWN.   Do not use on face/ open                           Wound or open sores. Avoid contact with eyes, ears mouth and genitals (private parts).                       Wash face,  Genitals (private parts) with your normal soap.             6.  Wash thoroughly, paying special attention to the area where your surgery  will be performed.  7.  Thoroughly rinse your body with warm water from the neck down.  8.  DO NOT shower/wash with your normal soap after using and rinsing off  the CHG Soap.                9.  Pat yourself dry with a clean towel.            10.  Wear clean pajamas.            11.  Place clean sheets on your bed the night of your first shower and do not  sleep with pets. Day of Surgery : Do not apply any lotions/deodorants the  morning of surgery.  Please wear clean clothes to the hospital/surgery center.  FAILURE TO FOLLOW THESE INSTRUCTIONS MAY RESULT IN THE CANCELLATION OF YOUR SURGERY PATIENT SIGNATURE_________________________________  NURSE SIGNATURE__________________________________  ________________________________________________________________________a

## 2023-12-27 NOTE — Progress Notes (Addendum)
 Anesthesia Review:  PCP:  Clayborne Molt with [facility]  Cardiologist : none   PPM/ ICD: Device Orders: Rep Notified:  Chest x-ray : EKG : Echo : Stress test: Cardiac Cath :   Activity level: can do a flight of stairs without difficutyo  Sleep Study/ CPAP : none  Fasting Blood Sugar :      / Checks Blood Sugar -- times a day:    Blood Thinner/ Instructions /Last Dose: ASA / Instructions/ Last Dose :   Wegovy- last dose on 12/19/2023 per daughter, [name].   PT speaks Arabic and some [place].  Family to be here for Interpreting.   Preop med hx and instructons completed with daughter.  , Shannon Knox.  Shannon Knox was instsructed to call Admitting at [phone] at end of phone call.  Voiced understanding.

## 2023-12-28 NOTE — Anesthesia Preprocedure Evaluation (Signed)
 Anesthesia Evaluation  Patient identified by MRN, date of birth, ID band Patient awake    Reviewed: Allergy & Precautions, NPO status , Patient's Chart, lab work & pertinent test results  Airway Mallampati: II  TM Distance: >3 FB Neck ROM: Full    Dental no notable dental hx. (+) Teeth Intact, Dental Advisory Given   Pulmonary asthma    Pulmonary exam normal breath sounds clear to auscultation       Cardiovascular (-) hypertension(-) angina (-) Past MI Normal cardiovascular exam Rhythm:Regular Rate:Normal     Neuro/Psych negative neurological ROS  negative psych ROS   GI/Hepatic negative GI ROS, Neg liver ROS,,,  Endo/Other  On Tzhncb for wt loss  Renal/GU      Musculoskeletal   Abdominal   Peds  Hematology   Anesthesia Other Findings   Reproductive/Obstetrics                              Anesthesia Physical Anesthesia Plan  ASA: 2  Anesthesia Plan: General   Post-op Pain Management: Toradol  IV (intra-op)*, Tylenol  PO (pre-op)* and Precedex    Induction: Intravenous  PONV Risk Score and Plan: 3 and Treatment may vary due to age or medical condition, Midazolam , Ondansetron , Dexamethasone , Propofol  infusion and TIVA  Airway Management Planned: LMA  Additional Equipment: None  Intra-op Plan:   Post-operative Plan: Extubation in OR  Informed Consent: I have reviewed the patients History and Physical, chart, labs and discussed the procedure including the risks, benefits and alternatives for the proposed anesthesia with the patient or authorized representative who has indicated his/her understanding and acceptance.     Dental advisory given  Plan Discussed with: CRNA and Surgeon  Anesthesia Plan Comments:          Anesthesia Quick Evaluation

## 2023-12-30 ENCOUNTER — Other Ambulatory Visit: Payer: Self-pay

## 2023-12-30 ENCOUNTER — Ambulatory Visit (HOSPITAL_COMMUNITY): Payer: Self-pay | Admitting: Anesthesiology

## 2023-12-30 ENCOUNTER — Encounter (HOSPITAL_COMMUNITY)
Admission: RE | Disposition: A | Discharge status: Home / Self Care | Payer: Self-pay | Source: Ambulatory Visit | Attending: Surgery

## 2023-12-30 ENCOUNTER — Encounter (HOSPITAL_COMMUNITY): Payer: Self-pay | Admitting: Surgery

## 2023-12-30 ENCOUNTER — Ambulatory Visit (HOSPITAL_COMMUNITY)
Admission: RE | Admit: 2023-12-30 | Discharge: 2023-12-30 | Disposition: A | Discharge status: Home / Self Care | Source: Ambulatory Visit | Attending: Surgery | Admitting: Surgery

## 2023-12-30 ENCOUNTER — Encounter (HOSPITAL_COMMUNITY): Payer: Self-pay | Admitting: Physician Assistant

## 2023-12-30 DIAGNOSIS — R1906 Epigastric swelling, mass or lump: Secondary | ICD-10-CM | POA: Insufficient documentation

## 2023-12-30 DIAGNOSIS — R1900 Intra-abdominal and pelvic swelling, mass and lump, unspecified site: Secondary | ICD-10-CM | POA: Diagnosis not present

## 2023-12-30 DIAGNOSIS — Z01818 Encounter for other preprocedural examination: Secondary | ICD-10-CM

## 2023-12-30 DIAGNOSIS — Z79899 Other long term (current) drug therapy: Secondary | ICD-10-CM | POA: Diagnosis not present

## 2023-12-30 HISTORY — PX: EXCISION MASS ABDOMINAL: SHX6701

## 2023-12-30 LAB — CBC
HCT: 37.7 % (ref 36.0–46.0)
Hemoglobin: 11.8 g/dL — ABNORMAL LOW (ref 12.0–15.0)
MCH: 26 pg (ref 26.0–34.0)
MCHC: 31.3 g/dL (ref 30.0–36.0)
MCV: 83.2 fL (ref 80.0–100.0)
Platelets: 219 K/uL (ref 150–400)
RBC: 4.53 MIL/uL (ref 3.87–5.11)
RDW: 14 % (ref 11.5–15.5)
WBC: 3.3 K/uL — ABNORMAL LOW (ref 4.0–10.5)
nRBC: 0 % (ref 0.0–0.2)

## 2023-12-30 SURGERY — EXCISION, MASS, TORSO
Anesthesia: General

## 2023-12-30 MED ORDER — 0.9 % SODIUM CHLORIDE (POUR BTL) OPTIME
TOPICAL | Status: DC | PRN
  Administered 2023-12-30: 1000 mL

## 2023-12-30 MED ORDER — ONDANSETRON HCL 4 MG/2ML IJ SOLN: 4.0000 mg | Freq: Once | INTRAMUSCULAR | Status: DC | PRN

## 2023-12-30 MED ORDER — ROCURONIUM BROMIDE 10 MG/ML (PF) SYRINGE
PREFILLED_SYRINGE | INTRAVENOUS | Status: AC
  Filled 2023-12-30: qty 10

## 2023-12-30 MED ORDER — HYDROMORPHONE HCL 1 MG/ML IJ SOLN: 0.2500 mg | INTRAMUSCULAR | Status: DC | PRN

## 2023-12-30 MED ORDER — ACETAMINOPHEN 500 MG PO TABS
1000.0000 mg | ORAL_TABLET | ORAL | Status: AC
  Administered 2023-12-30: 1000 mg via ORAL
  Filled 2023-12-30: qty 2

## 2023-12-30 MED ORDER — LACTATED RINGERS IV SOLN: INTRAVENOUS | Status: DC

## 2023-12-30 MED ORDER — DOCUSATE SODIUM 100 MG PO CAPS: 100.0000 mg | ORAL_CAPSULE | Freq: Two times a day (BID) | ORAL | 0 refills | Status: AC

## 2023-12-30 MED ORDER — PHENYLEPHRINE 80 MCG/ML (10ML) SYRINGE FOR IV PUSH (FOR BLOOD PRESSURE SUPPORT)
PREFILLED_SYRINGE | INTRAVENOUS | Status: DC | PRN
  Administered 2023-12-30: 160 ug via INTRAVENOUS

## 2023-12-30 MED ORDER — BUPIVACAINE HCL (PF) 0.25 % IJ SOLN
INTRAMUSCULAR | Status: AC
  Filled 2023-12-30: qty 30

## 2023-12-30 MED ORDER — ONDANSETRON HCL 4 MG/2ML IJ SOLN
INTRAMUSCULAR | Status: DC | PRN
Start: 2023-12-30 — End: 2023-12-30
  Administered 2023-12-30: 4 mg via INTRAVENOUS

## 2023-12-30 MED ORDER — FENTANYL CITRATE (PF) 100 MCG/2ML IJ SOLN
INTRAMUSCULAR | Status: DC | PRN
  Administered 2023-12-30: 50 ug via INTRAVENOUS

## 2023-12-30 MED ORDER — CHLORHEXIDINE GLUCONATE 0.12 % MT SOLN
15.0000 mL | Freq: Once | OROMUCOSAL | Status: AC
  Administered 2023-12-30: 15 mL via OROMUCOSAL

## 2023-12-30 MED ORDER — GABAPENTIN 300 MG PO CAPS
300.0000 mg | ORAL_CAPSULE | ORAL | Status: AC
  Administered 2023-12-30: 300 mg via ORAL
  Filled 2023-12-30: qty 1

## 2023-12-30 MED ORDER — MIDAZOLAM HCL 5 MG/5ML IJ SOLN
INTRAMUSCULAR | Status: DC | PRN
  Administered 2023-12-30: 1 mg via INTRAVENOUS

## 2023-12-30 MED ORDER — PHENYLEPHRINE HCL-NACL 20-0.9 MG/250ML-% IV SOLN
INTRAVENOUS | Status: DC | PRN
  Administered 2023-12-30: 25 ug via INTRAVENOUS

## 2023-12-30 MED ORDER — BUPIVACAINE-EPINEPHRINE 0.25% -1:200000 IJ SOLN
INTRAMUSCULAR | Status: DC | PRN
  Administered 2023-12-30: 30 mL

## 2023-12-30 MED ORDER — LIDOCAINE HCL (PF) 2 % IJ SOLN
INTRAMUSCULAR | Status: AC
  Filled 2023-12-30: qty 5

## 2023-12-30 MED ORDER — ORAL CARE MOUTH RINSE
15.0000 mL | Freq: Once | OROMUCOSAL | Status: AC
Start: 2023-12-30 — End: 2023-12-30

## 2023-12-30 MED ORDER — PROPOFOL 10 MG/ML IV BOLUS
INTRAVENOUS | Status: DC | PRN
  Administered 2023-12-30: 160 mg via INTRAVENOUS

## 2023-12-30 MED ORDER — CHLORHEXIDINE GLUCONATE 4 % EX SOLN: 60.0000 mL | Freq: Once | CUTANEOUS | Status: DC

## 2023-12-30 MED ORDER — DEXMEDETOMIDINE HCL IN NACL 80 MCG/20ML IV SOLN
INTRAVENOUS | Status: AC
  Filled 2023-12-30: qty 20

## 2023-12-30 MED ORDER — DEXAMETHASONE SODIUM PHOSPHATE 10 MG/ML IJ SOLN
INTRAMUSCULAR | Status: DC | PRN
  Administered 2023-12-30: 8 mg via INTRAVENOUS

## 2023-12-30 MED ORDER — PROPOFOL 1000 MG/100ML IV EMUL
INTRAVENOUS | Status: AC
Start: 2023-12-30 — End: 2023-12-30
  Filled 2023-12-30: qty 100

## 2023-12-30 MED ORDER — PROPOFOL 10 MG/ML IV BOLUS
INTRAVENOUS | Status: AC
  Filled 2023-12-30: qty 20

## 2023-12-30 MED ORDER — PHENYLEPHRINE 80 MCG/ML (10ML) SYRINGE FOR IV PUSH (FOR BLOOD PRESSURE SUPPORT)
PREFILLED_SYRINGE | INTRAVENOUS | Status: AC
  Filled 2023-12-30: qty 10

## 2023-12-30 MED ORDER — BUPIVACAINE-EPINEPHRINE (PF) 0.25% -1:200000 IJ SOLN
INTRAMUSCULAR | Status: AC
  Filled 2023-12-30: qty 30

## 2023-12-30 MED ORDER — KETOROLAC TROMETHAMINE 30 MG/ML IJ SOLN: 30.0000 mg | Freq: Once | INTRAMUSCULAR | Status: DC | PRN

## 2023-12-30 MED ORDER — LIDOCAINE HCL (CARDIAC) PF 100 MG/5ML IV SOSY
PREFILLED_SYRINGE | INTRAVENOUS | Status: DC | PRN
Start: 2023-12-30 — End: 2023-12-30
  Administered 2023-12-30: 80 mg via INTRAVENOUS

## 2023-12-30 MED ORDER — ONDANSETRON HCL 4 MG/2ML IJ SOLN
INTRAMUSCULAR | Status: AC
  Filled 2023-12-30: qty 2

## 2023-12-30 MED ORDER — MIDAZOLAM HCL 2 MG/2ML IJ SOLN
INTRAMUSCULAR | Status: AC
  Filled 2023-12-30: qty 2

## 2023-12-30 MED ORDER — OXYCODONE HCL 5 MG PO TABS: 5.0000 mg | ORAL_TABLET | Freq: Once | ORAL | Status: DC | PRN

## 2023-12-30 MED ORDER — OXYCODONE HCL 5 MG/5ML PO SOLN: 5.0000 mg | Freq: Once | ORAL | Status: DC | PRN

## 2023-12-30 MED ORDER — DEXMEDETOMIDINE HCL IN NACL 80 MCG/20ML IV SOLN
INTRAVENOUS | Status: DC | PRN
  Administered 2023-12-30: 8 ug via INTRAVENOUS

## 2023-12-30 MED ORDER — TRAMADOL HCL 50 MG PO TABS: 50.0000 mg | ORAL_TABLET | Freq: Four times a day (QID) | ORAL | 0 refills | Status: AC | PRN

## 2023-12-30 MED ORDER — PROPOFOL 500 MG/50ML IV EMUL
INTRAVENOUS | Status: DC | PRN
  Administered 2023-12-30: 125 ug/kg/min via INTRAVENOUS

## 2023-12-30 MED ORDER — CEFAZOLIN SODIUM-DEXTROSE 2-4 GM/100ML-% IV SOLN
2.0000 g | INTRAVENOUS | Status: AC
  Administered 2023-12-30: 2 g via INTRAVENOUS
  Filled 2023-12-30: qty 100

## 2023-12-30 MED ORDER — DEXAMETHASONE SODIUM PHOSPHATE 10 MG/ML IJ SOLN
INTRAMUSCULAR | Status: AC
Start: 2023-12-30 — End: 2023-12-30
  Filled 2023-12-30: qty 1

## 2023-12-30 MED ORDER — FENTANYL CITRATE (PF) 100 MCG/2ML IJ SOLN
INTRAMUSCULAR | Status: AC
Start: 2023-12-30 — End: 2023-12-30
  Filled 2023-12-30: qty 2

## 2023-12-30 SURGICAL SUPPLY — 25 items
BAG COUNTER SPONGE SURGICOUNT (BAG) IMPLANT
BENZOIN TINCTURE PRP APPL 2/3 (GAUZE/BANDAGES/DRESSINGS) IMPLANT
COVER SURGICAL LIGHT HANDLE (MISCELLANEOUS) ×1 IMPLANT
DERMABOND ADVANCED .7 DNX12 (GAUZE/BANDAGES/DRESSINGS) IMPLANT
DRAPE LAPAROSCOPIC ABDOMINAL (DRAPES) IMPLANT
DRAPE UTILITY XL STRL (DRAPES) ×1 IMPLANT
ELECT REM PT RETURN 15FT ADLT (MISCELLANEOUS) ×1 IMPLANT
GAUZE SPONGE 4X4 12PLY STRL (GAUZE/BANDAGES/DRESSINGS) ×1 IMPLANT
GLOVE BIO SURGEON STRL SZ 6 (GLOVE) ×1 IMPLANT
GLOVE INDICATOR 6.5 STRL GRN (GLOVE) ×1 IMPLANT
GOWN STRL REUS W/ TWL LRG LVL3 (GOWN DISPOSABLE) ×1 IMPLANT
KIT BASIN OR (CUSTOM PROCEDURE TRAY) ×1 IMPLANT
KIT TURNOVER KIT A (KITS) ×1 IMPLANT
MARKER SKIN DUAL TIP RULER LAB (MISCELLANEOUS) IMPLANT
NDL HYPO 22X1.5 SAFETY MO (MISCELLANEOUS) ×1 IMPLANT
NEEDLE HYPO 22X1.5 SAFETY MO (MISCELLANEOUS) ×1 IMPLANT
PACK GENERAL/GYN (CUSTOM PROCEDURE TRAY) ×1 IMPLANT
SPIKE FLUID TRANSFER (MISCELLANEOUS) IMPLANT
STRIP CLOSURE SKIN 1/2X4 (GAUZE/BANDAGES/DRESSINGS) IMPLANT
SUT MNCRL AB 4-0 PS2 18 (SUTURE) ×1 IMPLANT
SUT SILK 2 0 SH (SUTURE) IMPLANT
SUT VIC AB 3-0 SH 27XBRD (SUTURE) ×1 IMPLANT
SYR CONTROL 10ML LL (SYRINGE) ×1 IMPLANT
TAPE CLOTH SURG 4X10 WHT LF (GAUZE/BANDAGES/DRESSINGS) IMPLANT
TOWEL OR 17X26 10 PK STRL BLUE (TOWEL DISPOSABLE) ×1 IMPLANT

## 2023-12-30 NOTE — Transfer of Care (Signed)
 Immediate Anesthesia Transfer of Care Note  Patient: Shannon Knox  Procedure(s) Performed: EXCISION, MASS, TORSO  Patient Location: PACU  Anesthesia Type:General  Level of Consciousness: drowsy  Airway & Oxygen Therapy: Patient Spontanous Breathing and Patient connected to nasal cannula oxygen  Post-op Assessment: Report given to RN and Post -op Vital signs reviewed and stable  Post vital signs: Reviewed and stable  Last Vitals:  Vitals Value Taken Time  BP 91/62 12/30/23 10:13  Temp    Pulse 55 12/30/23 10:15  Resp 14 12/30/23 10:15  SpO2 98 % 12/30/23 10:15  Vitals shown include unfiled device data.  Last Pain:  Vitals:   12/30/23 0737  TempSrc:   PainSc: 0-No pain      Patients Stated Pain Goal: 5 (12/30/23 0732)  Complications: No notable events documented.

## 2023-12-30 NOTE — Anesthesia Postprocedure Evaluation (Signed)
 Anesthesia Post Note  Patient: Shannon Knox  Procedure(s) Performed: EXCISION, MASS, TORSO     Patient location during evaluation: PACU Anesthesia Type: General Level of consciousness: awake and alert Pain management: pain level controlled Vital Signs Assessment: post-procedure vital signs reviewed and stable Respiratory status: spontaneous breathing, nonlabored ventilation, respiratory function stable and patient connected to nasal cannula oxygen Cardiovascular status: blood pressure returned to baseline and stable Postop Assessment: no apparent nausea or vomiting Anesthetic complications: no   No notable events documented.  Last Vitals:  Vitals:   12/30/23 1115 12/30/23 1142  BP: 99/67 99/63  Pulse: (!) 51 (!) 54  Resp: 11 15  Temp: (!) 36.4 C 36.6 C  SpO2: 100% 96%    Last Pain:  Vitals:   12/30/23 1142  TempSrc:   PainSc: 4                  [name]

## 2023-12-30 NOTE — Interval H&P Note (Signed)
 History and Physical Interval Note:  12/30/2023 8:34 AM  Shannon Knox  has presented today for surgery, with the diagnosis of ABDOMINAL WALL MASS.  The various methods of treatment have been discussed with the patient and family. After consideration of risks, benefits and other options for treatment, the patient has consented to  Procedure(s) with comments: EXCISION, MASS, TORSO (N/A) - excision of abdominal wall mass as a surgical intervention.  The patient's history has been reviewed, patient examined, no change in status, stable for surgery.  I have reviewed the patient's chart and labs.  Questions were answered to the patient's satisfaction.     Kailene Steinhart DELENA Freund

## 2023-12-30 NOTE — Op Note (Signed)
 Operative Note  Kaylor Maiers  [id number]  [id number]  12/30/2023   Surgeon: [name] MD FACS   Procedure performed: excision of subcutaneous abdominal wall mass, 15x12x11cm   Preop diagnosis: abdominal wall mass Post-op diagnosis/intraop findings: same   Specimens: abdominal wall mass, stitch marks superior (skin marks anterior) Retained items: no  EBL: minimal cc Complications: none   Description of procedure: After confirming informed consent the patient was taken to the operating room and placed supine on operating room table where general LMA anesthesia was initiated, preoperative antibiotics were administered, SCDs applied, and a formal timeout was performed.  The abdomen was prepped and draped in the usual sterile fashion.  An elliptical incision was made following the Langer's lines over the large subxiphoid subcutaneous mass and cautery was used to dissect the subcutaneous and soft tissue until the surface of the mass was reached.  Combination of blunt dissection and cautery was then used to free the mass of its attachments to the surrounding soft tissues and abdominal wall.  The lesion was soft and lipomatous and abutted but did not penetrate the fascia.  There was no fascial defect to suggest a hernia.  Ultimately the mass was excised intact.  Measurements as above.  A stitch was placed superiorly for orientation and the skin marks the anterior aspect.  There was still quite a lot of redundant skin and therefore an additional wedge was excised on each side of the incision.  The wound was inspected for hemostasis.  Local was infiltrated along the abdominal wall, subcutaneous tissues and subcuticular spaces and a field block pattern.  The deep soft tissues and deep dermis were reapproximated with 3-0 Vicryl's and the skin was closed with running subcuticular 4-0 Monocryl.  Benzoin, Steri-Strips, and a pressure dressing of gauze and tape were then applied.  The patient was then  awakened, extubated and taken to PACU in stable condition.    All counts were correct at the completion of the case.

## 2023-12-30 NOTE — Discharge Instructions (Addendum)
 GENERAL SURGERY: POST OP INSTRUCTIONS  EAT Gradually transition to your usual diet over the next few days after discharge.  WALK Walk an hour a day (cumulative, not all at once).  Control your pain to do that.    CONTROL PAIN Control pain so that you can walk, sleep, tolerate sneezing/coughing, go up/down stairs.  HAVE A BOWEL MOVEMENT DAILY Keep your bowels regular to avoid problems.  OK to try a laxative to override constipation.  OK to use an antidiarrheal to slow down diarrhea.  Call if not better after 2 tries  CALL IF YOU HAVE PROBLEMS/CONCERNS Call if you are still struggling despite following these instructions. Call if you have concerns not answered by these instructions    DIET: Follow a light bland diet & liquids the first 24 hours after arrival home, such as soup, liquids, starches, etc.  Be sure to drink plenty of fluids.  Quickly advance to a usual solid diet within a few days.  Avoid fast food or heavy meals initially as your are more likely to get nauseated or have irregular bowels.    Take your usually prescribed home medications unless otherwise directed. PAIN CONTROL: Pain is best controlled by a usual combination of three different methods TOGETHER: Ice/Heat Over the counter pain medication Prescription pain medication Most patients will experience some swelling and bruising around the incisions.  Ice packs or heating pads (30-60 minutes up to 6 times a day) will help. Use ice for the first few days to help decrease swelling and bruising, then switch to heat to help relax tight/sore spots and speed recovery.  Some people prefer to use ice alone, heat alone, alternating between ice & heat.  Experiment to what works for you.  Swelling and bruising can take several weeks to resolve.   It is helpful to take an over-the-counter pain medication regularly for the first few days: Naproxen (Aleve, etc)  Two 220mg  tabs twice a day OR Ibuprofen (Advil, etc) Three 200mg  tabs four  times a day (every meal & bedtime)  AND Acetaminophen  (Tylenol , etc) 500-650mg  four times a day (every meal & bedtime) A  prescription for pain medication should be given to you upon discharge.  Take your pain medication as prescribed.  If you are having problems/concerns with the prescription medicine (does not control pain, nausea, vomiting, rash, itching, etc), please call us  [phone] to see if we need to switch you to a different pain medicine that will work better for you and/or control your side effect better. If you need a refill on your pain medication, please contact your pharmacy.  They will contact our office to request authorization. Prescriptions will not be filled after 5 pm or on week-ends. Avoid getting constipated.  Between the surgery and the pain medications, it is common to experience some constipation.  Increasing fluid intake and taking a fiber supplement (such as Metamucil, Citrucel, FiberCon, MiraLax, etc) 1-2 times a day regularly will usually help prevent this problem from occurring.  A mild laxative (prune juice, Milk of Magnesia, MiraLax, etc) should be taken according to package directions if there are no bowel movements after 48 hours.   Wash / shower every day, starting 2 days after surgery.  You may shower over the steri strips (small white tapes) as they are waterproof.  Continue to shower over incision(s) after the dressing is off. No rubbing, scrubbing, lotions or ointments to incision.  Remove your outer bandage (gauze and tape) 2 days after surgery; steri strips  will peel off after 1-2 weeks.  You may leave the incision open to air.  You may replace a dressing/Band-Aid to cover the incision for comfort if you wish.   ACTIVITIES as tolerated:   You may resume regular (light) daily activities beginning the next day--such as daily self-care, walking, climbing stairs--gradually increasing activities as tolerated.  If you can walk 30 minutes without difficulty, it is  safe to try more intense activity such as jogging, treadmill, bicycling, low-impact aerobics, swimming, etc. Save the most intensive and strenuous activity for last such as sit-ups, heavy lifting, contact sports, etc  Refrain from any heavy lifting or straining until you are off narcotics for pain control.   DO NOT PUSH THROUGH PAIN.  Let pain be your guide: If it hurts to do something, don't do it.  Pain is your body warning you to avoid that activity for another week until the pain goes down. You may drive when you are no longer taking prescription pain medication, you can comfortably wear a seatbelt, and you can safely maneuver your car and apply brakes. You may have sexual intercourse when it is comfortable.  FOLLOW UP in our office Please call [facility] at [phone] to set up an appointment to see your surgeon in the office for a follow-up appointment approximately 2-3 weeks after your surgery. Make sure that you call for this appointment the day you arrive home to insure a convenient appointment time. 9. IF YOU HAVE DISABILITY OR FAMILY LEAVE FORMS, BRING THEM TO THE OFFICE FOR PROCESSING.  DO NOT GIVE THEM TO YOUR DOCTOR.   WHEN TO CALL US  ([phone]: Poor pain control Reactions / problems with new medications (rash/itching, nausea, etc)  Fever over 101.5 F (38.5 C) Worsening swelling or bruising Continued bleeding from incision. Increased pain, redness, or drainage from the incision Difficulty breathing / swallowing   The clinic staff is available to answer your questions during regular business hours (8:30am-5pm).  Please don't hesitate to call and ask to speak to one of our nurses for clinical concerns.   If you have a medical emergency, go to the nearest emergency room or call 911.  A surgeon from [facility] is always on call at the [facility], [place] [address], [place], [place]  [address] ? MAIN: [phone] ? TOLL  FREE: [phone] ?  FAX [phone] [email or web address]

## 2023-12-30 NOTE — Anesthesia Procedure Notes (Signed)
 Procedure Name: LMA Insertion Date/Time: 12/30/2023 9:01 AM  Performed by: Landy Chip HERO, CRNAPre-anesthesia Checklist: Patient identified, Emergency Drugs available, Suction available and Patient being monitored Patient Re-evaluated:Patient Re-evaluated prior to induction Oxygen Delivery Method: Circle System Utilized Preoxygenation: Pre-oxygenation with 100% oxygen Induction Type: IV induction Ventilation: Mask ventilation without difficulty LMA: LMA inserted LMA Size: 4.0 Number of attempts: 1 Airway Equipment and Method: Bite block Placement Confirmation: positive ETCO2 Tube secured with: Tape Dental Injury: Teeth and Oropharynx as per pre-operative assessment

## 2023-12-31 ENCOUNTER — Encounter (HOSPITAL_COMMUNITY): Payer: Self-pay | Admitting: Surgery

## 2023-12-31 LAB — SURGICAL PATHOLOGY

## 2024-01-21 ENCOUNTER — Other Ambulatory Visit: Payer: Self-pay | Admitting: Physician Assistant

## 2024-01-21 DIAGNOSIS — N6001 Solitary cyst of right breast: Secondary | ICD-10-CM

## 2024-01-21 DIAGNOSIS — N6002 Solitary cyst of left breast: Secondary | ICD-10-CM
